# Patient Record
Sex: Female | Born: 1990 | Hispanic: No | Marital: Single | Smoking: Current every day smoker
Health system: Southern US, Community
[De-identification: ages and names within clinical notes are randomized; demographics above are authoritative.]

## PROBLEM LIST (undated history)

## (undated) DIAGNOSIS — F909 Attention-deficit hyperactivity disorder, unspecified type: Secondary | ICD-10-CM

## (undated) HISTORY — DX: Attention-deficit hyperactivity disorder, unspecified type: F90.9

## (undated) HISTORY — PX: NO PAST SURGERIES: SHX2092

---
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
## 2009-03-21 ENCOUNTER — Emergency Department: Payer: Self-pay | Admitting: Emergency Medicine

## 2009-06-03 ENCOUNTER — Emergency Department: Payer: Self-pay | Admitting: Internal Medicine

## 2009-06-05 ENCOUNTER — Other Ambulatory Visit: Payer: Self-pay | Admitting: Emergency Medicine

## 2010-01-06 ENCOUNTER — Emergency Department: Payer: Self-pay | Admitting: Internal Medicine

## 2010-01-13 ENCOUNTER — Emergency Department: Payer: Self-pay | Admitting: Internal Medicine

## 2010-02-25 ENCOUNTER — Emergency Department: Payer: Self-pay | Admitting: Emergency Medicine

## 2010-04-15 ENCOUNTER — Emergency Department: Payer: Self-pay | Admitting: Emergency Medicine

## 2010-04-22 ENCOUNTER — Observation Stay: Payer: Self-pay | Admitting: Obstetrics and Gynecology

## 2010-05-28 ENCOUNTER — Emergency Department: Payer: Self-pay | Admitting: Emergency Medicine

## 2010-08-03 ENCOUNTER — Observation Stay: Payer: Self-pay

## 2010-08-06 ENCOUNTER — Inpatient Hospital Stay: Payer: Self-pay | Admitting: Obstetrics and Gynecology

## 2010-09-15 ENCOUNTER — Emergency Department: Payer: Self-pay | Admitting: Unknown Physician Specialty

## 2013-01-30 ENCOUNTER — Emergency Department: Payer: Self-pay | Admitting: Emergency Medicine

## 2013-01-30 LAB — COMPREHENSIVE METABOLIC PANEL
Albumin: 3.6 g/dL (ref 3.4–5.0)
Anion Gap: 3 — ABNORMAL LOW (ref 7–16)
BUN: 11 mg/dL (ref 7–18)
Calcium, Total: 8.4 mg/dL — ABNORMAL LOW (ref 8.5–10.1)
Chloride: 110 mmol/L — ABNORMAL HIGH (ref 98–107)
Co2: 28 mmol/L (ref 21–32)
Creatinine: 0.78 mg/dL (ref 0.60–1.30)
Potassium: 3.7 mmol/L (ref 3.5–5.1)
SGPT (ALT): 18 U/L (ref 12–78)
Sodium: 141 mmol/L (ref 136–145)
Total Protein: 7.4 g/dL (ref 6.4–8.2)

## 2013-01-30 LAB — URINALYSIS, COMPLETE
Bacteria: NONE SEEN
Glucose,UR: NEGATIVE mg/dL (ref 0–75)
Ketone: NEGATIVE
Leukocyte Esterase: NEGATIVE
Nitrite: NEGATIVE
Ph: 7 (ref 4.5–8.0)
Specific Gravity: 1.026 (ref 1.003–1.030)
WBC UR: 1 /HPF (ref 0–5)

## 2013-01-30 LAB — CBC
HCT: 34 % — ABNORMAL LOW (ref 35.0–47.0)
HGB: 11.4 g/dL — ABNORMAL LOW (ref 12.0–16.0)
MCH: 27.7 pg (ref 26.0–34.0)
MCHC: 33.4 g/dL (ref 32.0–36.0)
MCV: 83 fL (ref 80–100)
RBC: 4.1 10*6/uL (ref 3.80–5.20)
RDW: 14.3 % (ref 11.5–14.5)
WBC: 6.8 10*3/uL (ref 3.6–11.0)

## 2013-02-07 ENCOUNTER — Emergency Department: Payer: Self-pay | Admitting: Emergency Medicine

## 2013-03-22 ENCOUNTER — Ambulatory Visit: Payer: Self-pay | Admitting: Family Medicine

## 2013-05-19 ENCOUNTER — Emergency Department: Payer: Self-pay | Admitting: Emergency Medicine

## 2013-05-19 LAB — CBC
HCT: 35.7 % (ref 35.0–47.0)
HGB: 11.8 g/dL — AB (ref 12.0–16.0)
MCH: 27 pg (ref 26.0–34.0)
MCHC: 33 g/dL (ref 32.0–36.0)
MCV: 82 fL (ref 80–100)
Platelet: 266 10*3/uL (ref 150–440)
RBC: 4.36 10*6/uL (ref 3.80–5.20)
RDW: 14.4 % (ref 11.5–14.5)
WBC: 8.3 10*3/uL (ref 3.6–11.0)

## 2013-05-19 LAB — URINALYSIS, COMPLETE
Bacteria: NONE SEEN
Bilirubin,UR: NEGATIVE
Blood: NEGATIVE
Glucose,UR: NEGATIVE mg/dL (ref 0–75)
KETONE: NEGATIVE
Nitrite: NEGATIVE
Ph: 6 (ref 4.5–8.0)
Protein: NEGATIVE
RBC,UR: 4 /HPF (ref 0–5)
SPECIFIC GRAVITY: 1.023 (ref 1.003–1.030)
Squamous Epithelial: 15
WBC UR: 2 /HPF (ref 0–5)

## 2014-01-10 ENCOUNTER — Emergency Department: Payer: Self-pay | Admitting: Emergency Medicine

## 2014-01-10 LAB — URINALYSIS, COMPLETE
BACTERIA: NONE SEEN
BLOOD: NEGATIVE
Bilirubin,UR: NEGATIVE
GLUCOSE, UR: NEGATIVE mg/dL (ref 0–75)
Ketone: NEGATIVE
NITRITE: NEGATIVE
Ph: 5 (ref 4.5–8.0)
Protein: NEGATIVE
RBC,UR: 8 /HPF (ref 0–5)
SPECIFIC GRAVITY: 1.029 (ref 1.003–1.030)
Squamous Epithelial: 19
WBC UR: 9 /HPF (ref 0–5)

## 2014-01-10 LAB — HCG, QUANTITATIVE, PREGNANCY: BETA HCG, QUANT.: 41857 m[IU]/mL — AB

## 2014-01-10 LAB — COMPREHENSIVE METABOLIC PANEL
ALBUMIN: 3.7 g/dL (ref 3.4–5.0)
ALK PHOS: 76 U/L
AST: 22 U/L (ref 15–37)
Anion Gap: 9 (ref 7–16)
BUN: 9 mg/dL (ref 7–18)
Bilirubin,Total: 0.4 mg/dL (ref 0.2–1.0)
CHLORIDE: 103 mmol/L (ref 98–107)
CO2: 25 mmol/L (ref 21–32)
Calcium, Total: 8.9 mg/dL (ref 8.5–10.1)
Creatinine: 0.76 mg/dL (ref 0.60–1.30)
EGFR (African American): >60
Glucose: 76 mg/dL (ref 65–99)
OSMOLALITY: 271 (ref 275–301)
POTASSIUM: 3.7 mmol/L (ref 3.5–5.1)
SGPT (ALT): 18 U/L
SODIUM: 137 mmol/L (ref 136–145)
TOTAL PROTEIN: 7.4 g/dL (ref 6.4–8.2)

## 2014-01-10 LAB — CBC
HCT: 35.1 % (ref 35.0–47.0)
HGB: 11.3 g/dL — ABNORMAL LOW (ref 12.0–16.0)
MCH: 27 pg (ref 26.0–34.0)
MCHC: 32.2 g/dL (ref 32.0–36.0)
MCV: 84 fL (ref 80–100)
Platelet: 214 10*3/uL (ref 150–440)
RBC: 4.18 10*6/uL (ref 3.80–5.20)
RDW: 14.6 % — AB (ref 11.5–14.5)
WBC: 10.7 10*3/uL (ref 3.6–11.0)

## 2014-03-06 ENCOUNTER — Emergency Department: Payer: Self-pay | Admitting: Emergency Medicine

## 2014-03-06 LAB — COMPREHENSIVE METABOLIC PANEL
ALK PHOS: 80 U/L
ANION GAP: 8 (ref 7–16)
Albumin: 2.9 g/dL — ABNORMAL LOW (ref 3.4–5.0)
BILIRUBIN TOTAL: 0.1 mg/dL — AB (ref 0.2–1.0)
BUN: 7 mg/dL (ref 7–18)
CHLORIDE: 105 mmol/L (ref 98–107)
Calcium, Total: 8.2 mg/dL — ABNORMAL LOW (ref 8.5–10.1)
Co2: 26 mmol/L (ref 21–32)
Creatinine: 0.7 mg/dL (ref 0.60–1.30)
EGFR (Non-African Amer.): >60
GLUCOSE: 113 mg/dL — AB (ref 65–99)
OSMOLALITY: 276 (ref 275–301)
Potassium: 3.6 mmol/L (ref 3.5–5.1)
SGOT(AST): 15 U/L (ref 15–37)
SGPT (ALT): 16 U/L
SODIUM: 139 mmol/L (ref 136–145)
TOTAL PROTEIN: 6.7 g/dL (ref 6.4–8.2)

## 2014-03-06 LAB — URINALYSIS, COMPLETE
BILIRUBIN, UR: NEGATIVE
Bacteria: NONE SEEN
Blood: NEGATIVE
Glucose,UR: NEGATIVE mg/dL (ref 0–75)
Nitrite: NEGATIVE
PH: 6 (ref 4.5–8.0)
Protein: NEGATIVE
RBC,UR: 5 /HPF (ref 0–5)
Specific Gravity: 1.029 (ref 1.003–1.030)
Squamous Epithelial: 15
WBC UR: 11 /HPF (ref 0–5)

## 2014-03-06 LAB — HCG, QUANTITATIVE, PREGNANCY: Beta Hcg, Quant.: 48965 m[IU]/mL — ABNORMAL HIGH

## 2014-03-06 LAB — CBC WITH DIFFERENTIAL/PLATELET
Basophil #: 0 10*3/uL (ref 0.0–0.1)
Basophil %: 0.4 %
EOS ABS: 0.2 10*3/uL (ref 0.0–0.7)
EOS PCT: 1.4 %
HCT: 32.7 % — ABNORMAL LOW (ref 35.0–47.0)
HGB: 10.5 g/dL — ABNORMAL LOW (ref 12.0–16.0)
Lymphocyte #: 1.9 10*3/uL (ref 1.0–3.6)
Lymphocyte %: 18.1 %
MCH: 26.9 pg (ref 26.0–34.0)
MCHC: 32.3 g/dL (ref 32.0–36.0)
MCV: 84 fL (ref 80–100)
MONO ABS: 0.5 x10 3/mm (ref 0.2–0.9)
MONOS PCT: 4.9 %
Neutrophil #: 8 10*3/uL — ABNORMAL HIGH (ref 1.4–6.5)
Neutrophil %: 75.2 %
Platelet: 204 10*3/uL (ref 150–440)
RBC: 3.92 10*6/uL (ref 3.80–5.20)
RDW: 13.6 % (ref 11.5–14.5)
WBC: 10.7 10*3/uL (ref 3.6–11.0)

## 2014-03-28 ENCOUNTER — Emergency Department: Payer: Self-pay | Admitting: Emergency Medicine

## 2014-03-28 LAB — COMPREHENSIVE METABOLIC PANEL
Albumin: 3 g/dL — ABNORMAL LOW (ref 3.4–5.0)
Alkaline Phosphatase: 89 U/L
Anion Gap: 5 — ABNORMAL LOW (ref 7–16)
BUN: 6 mg/dL — AB (ref 7–18)
Bilirubin,Total: 0.3 mg/dL (ref 0.2–1.0)
CALCIUM: 8.2 mg/dL — AB (ref 8.5–10.1)
CHLORIDE: 107 mmol/L (ref 98–107)
Co2: 28 mmol/L (ref 21–32)
Creatinine: 0.54 mg/dL — ABNORMAL LOW (ref 0.60–1.30)
EGFR (Non-African Amer.): >60
GLUCOSE: 81 mg/dL (ref 65–99)
Osmolality: 276 (ref 275–301)
Potassium: 3.7 mmol/L (ref 3.5–5.1)
SGOT(AST): 16 U/L (ref 15–37)
SGPT (ALT): 20 U/L
Sodium: 140 mmol/L (ref 136–145)
TOTAL PROTEIN: 7.2 g/dL (ref 6.4–8.2)

## 2014-03-28 LAB — URINALYSIS, COMPLETE
BILIRUBIN, UR: NEGATIVE
Bacteria: NONE SEEN
GLUCOSE, UR: NEGATIVE mg/dL (ref 0–75)
Ketone: NEGATIVE
Nitrite: NEGATIVE
Ph: 5 (ref 4.5–8.0)
RBC,UR: 405 /HPF (ref 0–5)
SPECIFIC GRAVITY: 1.027 (ref 1.003–1.030)
Squamous Epithelial: 7

## 2014-03-28 LAB — CBC
HCT: 31.8 % — ABNORMAL LOW (ref 35.0–47.0)
HGB: 10.3 g/dL — ABNORMAL LOW (ref 12.0–16.0)
MCH: 27 pg (ref 26.0–34.0)
MCHC: 32.3 g/dL (ref 32.0–36.0)
MCV: 84 fL (ref 80–100)
PLATELETS: 223 10*3/uL (ref 150–440)
RBC: 3.8 10*6/uL (ref 3.80–5.20)
RDW: 13.5 % (ref 11.5–14.5)
WBC: 11.8 10*3/uL — ABNORMAL HIGH (ref 3.6–11.0)

## 2014-03-28 LAB — HCG, QUANTITATIVE, PREGNANCY: BETA HCG, QUANT.: 23052 m[IU]/mL — AB

## 2014-06-18 ENCOUNTER — Observation Stay: Payer: Self-pay

## 2014-06-19 LAB — CBC WITH DIFFERENTIAL/PLATELET
BASOS ABS: 0 10*3/uL (ref 0.0–0.1)
Basophil %: 0.3 %
EOS ABS: 0.2 10*3/uL (ref 0.0–0.7)
Eosinophil %: 1.8 %
HCT: 26 % — ABNORMAL LOW (ref 35.0–47.0)
HGB: 8.4 g/dL — ABNORMAL LOW (ref 12.0–16.0)
LYMPHS ABS: 2.3 10*3/uL (ref 1.0–3.6)
LYMPHS PCT: 16.8 %
MCH: 26.2 pg (ref 26.0–34.0)
MCHC: 32.1 g/dL (ref 32.0–36.0)
MCV: 82 fL (ref 80–100)
Monocyte #: 1.2 x10 3/mm — ABNORMAL HIGH (ref 0.2–0.9)
Monocyte %: 8.6 %
NEUTROS ABS: 9.9 10*3/uL — AB (ref 1.4–6.5)
NEUTROS PCT: 72.5 %
PLATELETS: 217 10*3/uL (ref 150–440)
RBC: 3.19 10*6/uL — AB (ref 3.80–5.20)
RDW: 15.8 % — ABNORMAL HIGH (ref 11.5–14.5)
WBC: 13.7 10*3/uL — ABNORMAL HIGH (ref 3.6–11.0)

## 2014-06-19 LAB — COMPREHENSIVE METABOLIC PANEL
Albumin: 2.4 g/dL — ABNORMAL LOW (ref 3.4–5.0)
Alkaline Phosphatase: 130 U/L — ABNORMAL HIGH (ref 46–116)
Anion Gap: 8 (ref 7–16)
BUN: 8 mg/dL (ref 7–18)
Bilirubin,Total: 0.2 mg/dL (ref 0.2–1.0)
CHLORIDE: 106 mmol/L (ref 98–107)
CO2: 23 mmol/L (ref 21–32)
Calcium, Total: 8.5 mg/dL (ref 8.5–10.1)
Creatinine: 0.56 mg/dL — ABNORMAL LOW (ref 0.60–1.30)
EGFR (Non-African Amer.): >60
Glucose: 94 mg/dL (ref 65–99)
Osmolality: 272 (ref 275–301)
Potassium: 3.2 mmol/L — ABNORMAL LOW (ref 3.5–5.1)
SGOT(AST): 11 U/L — ABNORMAL LOW (ref 15–37)
SGPT (ALT): 14 U/L (ref 14–63)
SODIUM: 137 mmol/L (ref 136–145)
TOTAL PROTEIN: 6.2 g/dL — AB (ref 6.4–8.2)

## 2014-06-19 LAB — URINALYSIS, COMPLETE
Bilirubin,UR: NEGATIVE
Nitrite: NEGATIVE
Ph: 5 (ref 4.5–8.0)
RBC,UR: 3 /HPF (ref 0–5)
Specific Gravity: 1.031 (ref 1.003–1.030)
Squamous Epithelial: 5

## 2014-06-20 LAB — URINE CULTURE

## 2014-08-05 ENCOUNTER — Observation Stay: Payer: Self-pay | Admitting: Obstetrics & Gynecology

## 2014-08-05 LAB — URINALYSIS, COMPLETE
BILIRUBIN, UR: NEGATIVE
Bacteria: NONE SEEN
Glucose,UR: NEGATIVE mg/dL (ref 0–75)
KETONE: NEGATIVE
Nitrite: NEGATIVE
PROTEIN: NEGATIVE
Ph: 6 (ref 4.5–8.0)
RBC,UR: <1 /HPF (ref 0–5)
SPECIFIC GRAVITY: 1.015 (ref 1.003–1.030)
WBC UR: NONE SEEN /HPF (ref 0–5)

## 2014-08-17 ENCOUNTER — Observation Stay
Admit: 2014-08-17 | Disposition: A | Discharge status: Home / Self Care | Payer: Self-pay | Attending: Certified Nurse Midwife | Admitting: Certified Nurse Midwife

## 2014-08-30 ENCOUNTER — Inpatient Hospital Stay
Admit: 2014-08-30 | Disposition: A | Discharge status: Home / Self Care | Payer: Self-pay | Attending: Certified Nurse Midwife | Admitting: Certified Nurse Midwife

## 2014-08-30 LAB — CBC WITH DIFFERENTIAL/PLATELET
BASOS ABS: 0.1 10*3/uL (ref 0.0–0.1)
Basophil %: 0.4 %
Eosinophil #: 0.2 10*3/uL (ref 0.0–0.7)
Eosinophil %: 1.6 %
HCT: 29.8 % — ABNORMAL LOW (ref 35.0–47.0)
HGB: 9.5 g/dL — ABNORMAL LOW (ref 12.0–16.0)
Lymphocyte #: 2.4 10*3/uL (ref 1.0–3.6)
Lymphocyte %: 17.4 %
MCH: 25.8 pg — ABNORMAL LOW (ref 26.0–34.0)
MCHC: 31.9 g/dL — ABNORMAL LOW (ref 32.0–36.0)
MCV: 81 fL (ref 80–100)
MONO ABS: 0.9 x10 3/mm (ref 0.2–0.9)
MONOS PCT: 6.3 %
Neutrophil #: 10.3 10*3/uL — ABNORMAL HIGH (ref 1.4–6.5)
Neutrophil %: 74.3 %
PLATELETS: 259 10*3/uL (ref 150–440)
RBC: 3.69 10*6/uL — ABNORMAL LOW (ref 3.80–5.20)
RDW: 18.1 % — AB (ref 11.5–14.5)
WBC: 13.9 10*3/uL — AB (ref 3.6–11.0)

## 2014-08-31 LAB — HEMATOCRIT: HCT: 26.5 % — ABNORMAL LOW (ref 35.0–47.0)

## 2014-08-31 LAB — GC/CHLAMYDIA PROBE AMP

## 2014-09-03 ENCOUNTER — Emergency Department
Admit: 2014-09-03 | Disposition: A | Discharge status: Home / Self Care | Payer: Self-pay | Admitting: Emergency Medicine

## 2014-09-03 LAB — COMPREHENSIVE METABOLIC PANEL
ALBUMIN: 3 g/dL — AB
ALK PHOS: 156 U/L — AB
ALT: 21 U/L
ANION GAP: 8 (ref 7–16)
BUN: 7 mg/dL
Bilirubin,Total: 0.3 mg/dL
CALCIUM: 8.7 mg/dL — AB
CO2: 24 mmol/L
Chloride: 106 mmol/L
Creatinine: 0.53 mg/dL
EGFR (African American): >60
EGFR (Non-African Amer.): >60
Glucose: 94 mg/dL
Potassium: 3.6 mmol/L
SGOT(AST): 24 U/L
Sodium: 138 mmol/L
Total Protein: 6.8 g/dL

## 2014-09-03 LAB — URINALYSIS, COMPLETE
Bilirubin,UR: NEGATIVE
GLUCOSE, UR: NEGATIVE mg/dL (ref 0–75)
Ketone: NEGATIVE
NITRITE: NEGATIVE
PROTEIN: NEGATIVE
Ph: 6 (ref 4.5–8.0)
Specific Gravity: 1.005 (ref 1.003–1.030)

## 2014-09-03 LAB — CBC WITH DIFFERENTIAL/PLATELET
Basophil #: 0 10*3/uL (ref 0.0–0.1)
Basophil %: 0.2 %
EOS PCT: 4.3 %
Eosinophil #: 0.6 10*3/uL (ref 0.0–0.7)
HCT: 29.7 % — ABNORMAL LOW (ref 35.0–47.0)
HGB: 9.4 g/dL — AB (ref 12.0–16.0)
LYMPHS ABS: 1.6 10*3/uL (ref 1.0–3.6)
Lymphocyte %: 11.8 %
MCH: 25.6 pg — AB (ref 26.0–34.0)
MCHC: 31.7 g/dL — ABNORMAL LOW (ref 32.0–36.0)
MCV: 81 fL (ref 80–100)
MONOS PCT: 6.5 %
Monocyte #: 0.9 x10 3/mm (ref 0.2–0.9)
NEUTROS ABS: 10.2 10*3/uL — AB (ref 1.4–6.5)
Neutrophil %: 77.2 %
PLATELETS: 255 10*3/uL (ref 150–440)
RBC: 3.68 10*6/uL — AB (ref 3.80–5.20)
RDW: 18.3 % — ABNORMAL HIGH (ref 11.5–14.5)
WBC: 13.2 10*3/uL — ABNORMAL HIGH (ref 3.6–11.0)

## 2014-09-03 LAB — WET PREP, GENITAL

## 2014-09-04 LAB — GC/CHLAMYDIA PROBE AMP

## 2014-09-20 NOTE — H&P (Signed)
L&D Evaluation:  History:  HPI Pt is a G3P1011 at 29 weeks with an EDC of 08/31/14 presents with reports of diffuse abdominal pain since yesterday that has gotten worse. She denies back pain, cramping, nausea, vomiting, diarrhea, fever, chills, or sick contacts. Her prenatal course is significant for obesity with BMI of 32. She is O+, RI, VI.   Presents with abdominal pain   Patient's Surgical History none   Medications Pre Natal Vitamins   Allergies augmentin   Social History none   Family History Non-Contributory   ROS:  ROS All systems were reviewed.  HEENT, CNS, GI, GU, Respiratory, CV, Renal and Musculoskeletal systems were found to be normal.   Exam:  Vital Signs stable   General no apparent distress, pt sleeping everytime provider or nurse goes into the room to see the patient   Mental Status clear   Chest clear   Heart normal sinus rhythm   Abdomen tender on the lower abdomen   Back no CVAT   Mebranes Intact   FHT normal rate with no decels, 145, moderate variability, +accels, no decels   Ucx absent   Skin dry, no lesions, no rashes   Lymph no lymphadenopathy   Other UA- 1+ ketones, 1.031, negative nitrites, 3 RBCs, 9 WBCs, trace bacteria, 5 epi cells.  CBC- WBC 13.7 H&H- 8.4, 26.0   Impression:  Impression UTI, reactive NST, IUP, UTI, iron deficiency anemia, reactive NST   Plan:  Plan discharge, po antibiotics x1 here, encourage PO hydration. Will send pt home with rx for abx for UTI.   Follow Up Appointment already scheduled   Electronic Signatures: Jannet MantisSubudhi, Aubrei Bouchie (CNM)  (Signed 07-Feb-16 02:41)  Authored: L&D Evaluation   Last Updated: 07-Feb-16 02:41 by Jannet MantisSubudhi, Allex Madia (CNM)

## 2014-09-20 NOTE — H&P (Signed)
L&D Evaluation:  History:  HPI Pt is a G3P1011 at 39.6 weeks with an EDC of 08/31/14 presents with reports of contractions and a gush of fluid. +FM. She denies vaginal bleeding. Her prenatal course is significant for obesity with BMI of 32 and smoking. She is O+, RI, VI, and GBS negative.   Presents with contractions   Patient's Medical History Obesity   Patient's Surgical History none   Medications Pre Natal Vitamins   Allergies augmentin   Social History tobacco   Family History Non-Contributory   ROS:  ROS All systems were reviewed.  HEENT, CNS, GI, GU, Respiratory, CV, Renal and Musculoskeletal systems were found to be normal.   Exam:  Vital Signs stable   General no apparent distress   Mental Status clear   Chest clear   Heart normal sinus rhythm   Abdomen gravid, non-tender   Estimated Fetal Weight Average for gestational age   Pelvic no external lesions, 4/50/-2 at 4:21am   Mebranes negative nitrazine.   FHT normal rate with no decels   Ucx irregular, every 3-5 minutes   Skin dry, no lesions, no rashes   Impression:  Impression reactive NST, IUP at 649w6d, r/o labor, no evidence of SROM   Plan:  Plan EFM/NST, monitor contractions and for cervical change, will reassess cervix in 1-2 hours   Follow Up Appointment already scheduled. today   Electronic Signatures: Jannet MantisSubudhi, Kolleen Ochsner (CNM)  (Signed 19-Apr-16 04:59)  Authored: L&D Evaluation   Last Updated: 19-Apr-16 04:59 by Jannet MantisSubudhi, Henessy Rohrer (CNM)

## 2014-09-20 NOTE — H&P (Signed)
L&D Evaluation:  History Expanded:  HPI Pt is a G3P1011 at 36 weeks with an EDC of 08/31/14 presents with reports of contractions for a few days. She denies vaginal bleeding, ROM, back pain, nausea, vomiting, diarrhea, fever, chills, or sick contacts. Her prenatal course is significant for obesity with BMI of 32. She is O+, RI, VI.   Blood Type (Maternal) O positive   EDC 31-Aug-2014   Presents with contractions   Patient's Medical History Obesity   Patient's Surgical History none   Medications Pre Natal Vitamins   Allergies augmentin   Social History none   Family History Non-Contributory   ROS:  ROS All systems were reviewed.  HEENT, CNS, GI, GU, Respiratory, CV, Renal and Musculoskeletal systems were found to be normal.   Exam:  Vital Signs stable   General no apparent distress   Mental Status clear   Chest clear   Heart normal sinus rhythm   Abdomen gravid, non-tender   Estimated Fetal Weight Average for gestational age   Back no CVAT   Edema no edema   Pelvic no external lesions, cervix closed and thick   Mebranes Intact   FHT normal rate with no decels, 145, accels   Ucx absent   Skin dry, no lesions, no rashes   Impression:  Impression IUP, false labor   Plan:  Plan UA, EFM/NST, monitor contractions and for cervical change, fluids   Comments A NST procedure was performed with FHR monitoring and a normal baseline established, appropriate time of 20-40 minutes of evaluation, and accels >2 seen w 15x15 characteristics.  Results show a REACTIVE Non-Stress Test.  Monitor for UTI.   Follow Up Appointment already scheduled   Electronic Signatures: Letitia LibraHarris, Robert Paul (MD)  (Signed 25-Mar-16 15:16)  Authored: L&D Evaluation   Last Updated: 25-Mar-16 15:16 by Letitia LibraHarris, Robert Paul (MD)

## 2015-06-18 IMAGING — US US OB LIMITED
1 series · 14 of 22 positions shown · non-contrast
Comparison: Prior examination 03/06/2014.

CLINICAL DATA: Second trimester pregnancy with vaginal bleeding.
LMP 11/23/2013.

EXAM:
LIMITED OBSTETRIC ULTRASOUND

[Series 1: us ob limited · 0.22mm/px · 14 of 22 slices shown]
[im 1/22]
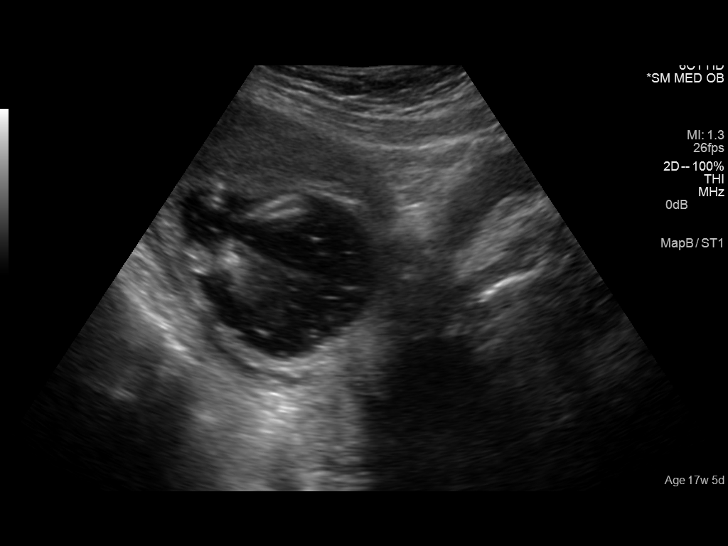
[im 3/22]
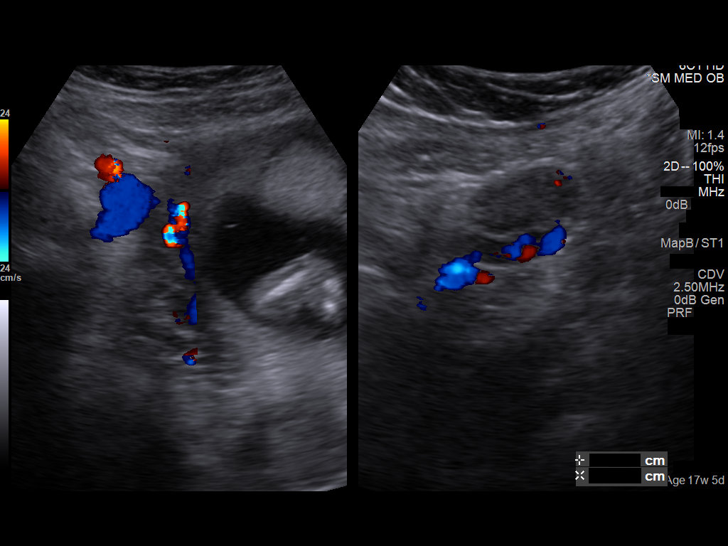
[im 4/22]
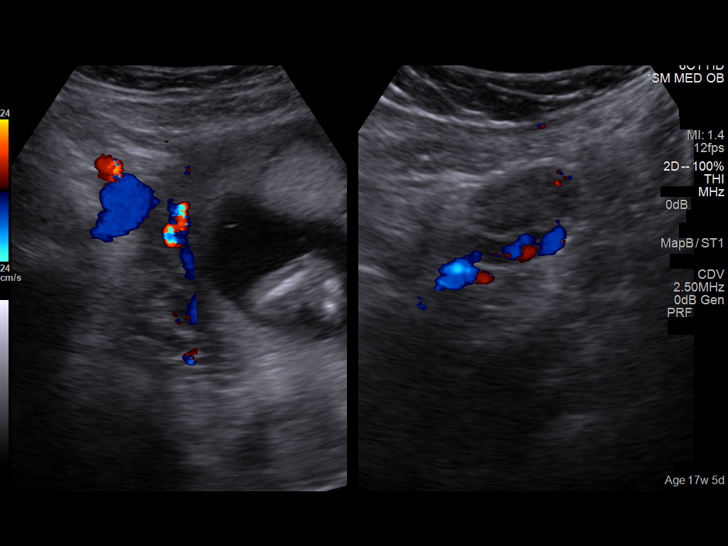
[im 6/22]
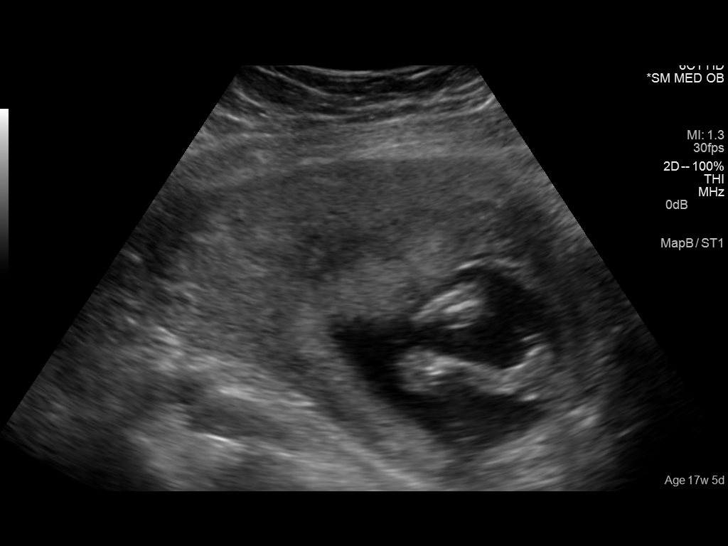
[im 8/22]
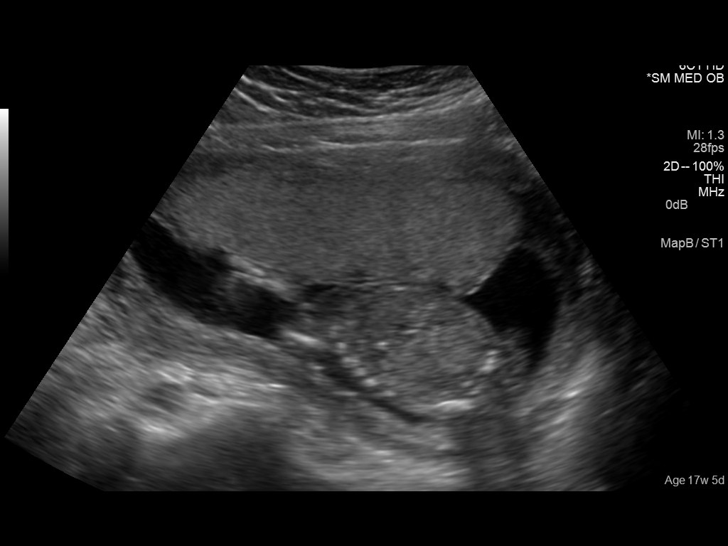
[im 9/22]
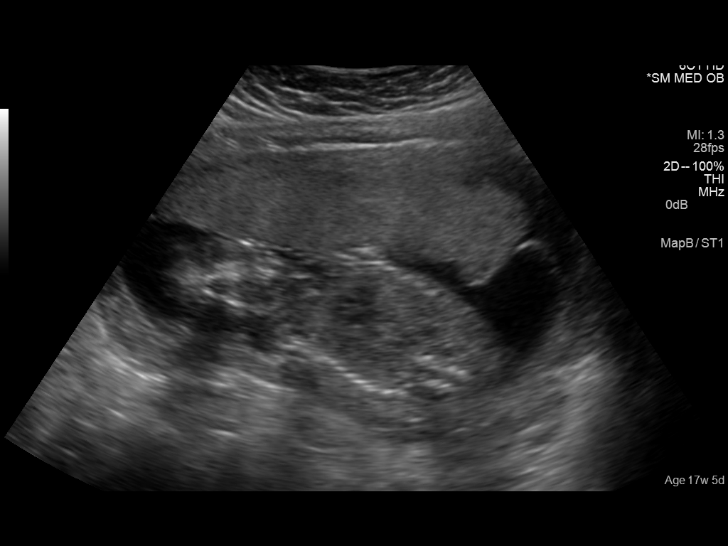
[im 11/22]
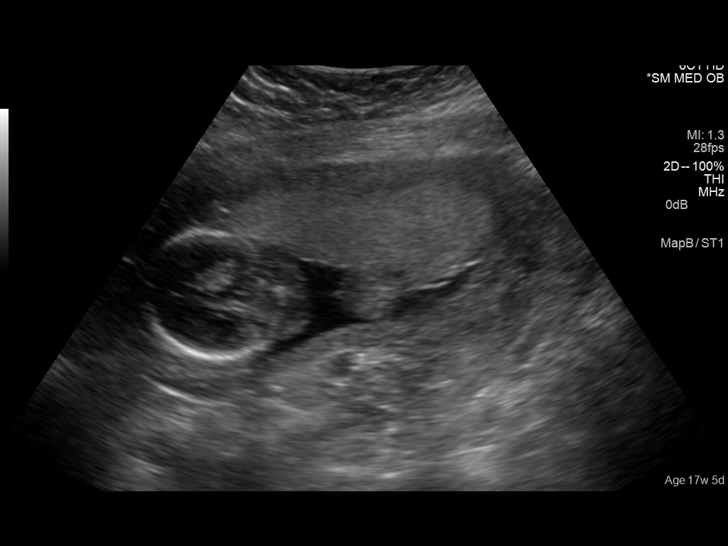
[im 12/22]
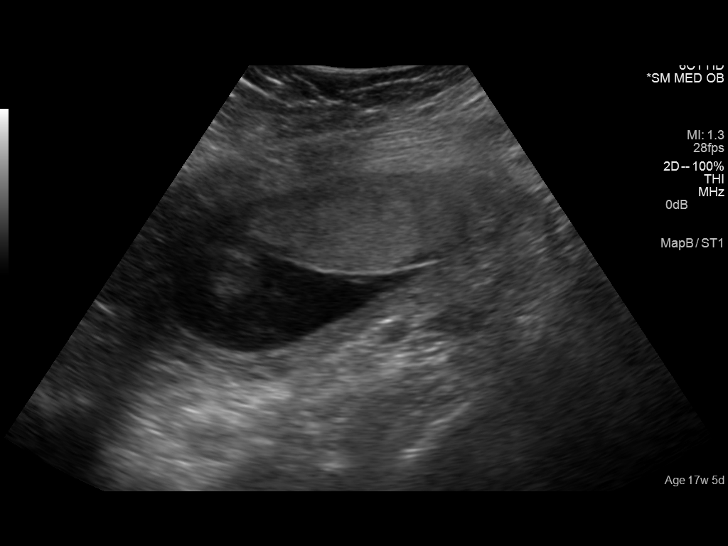
[im 14/22]
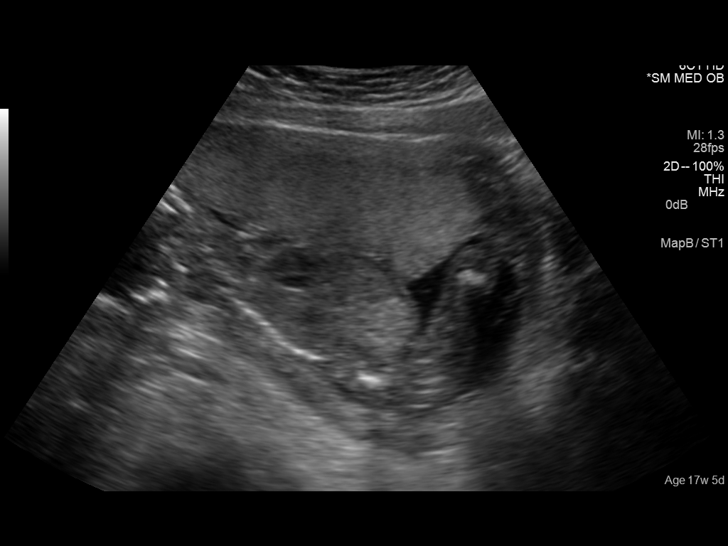
[im 15/22]
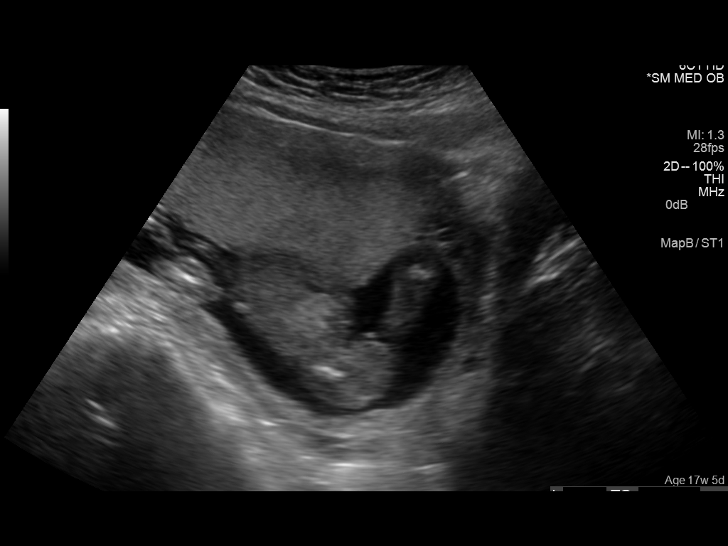
[im 17/22]
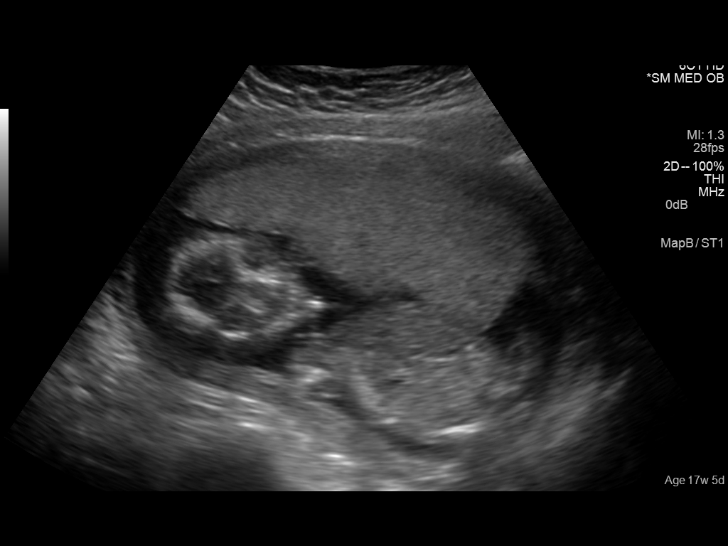
[im 19/22]
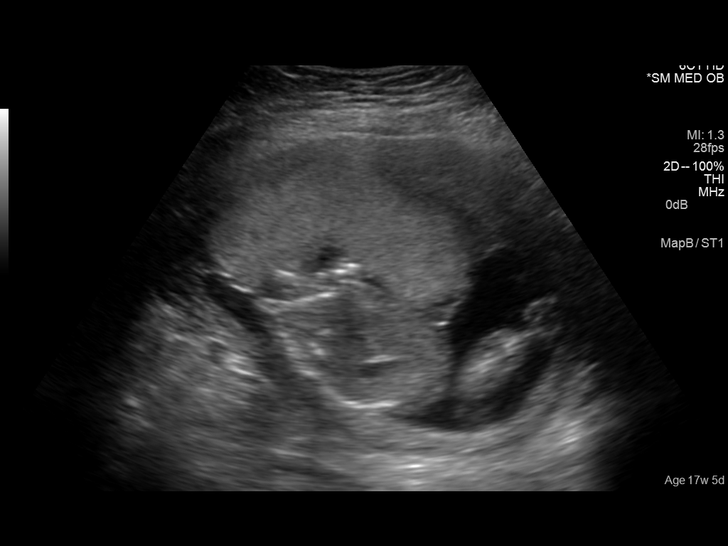
[im 20/22]
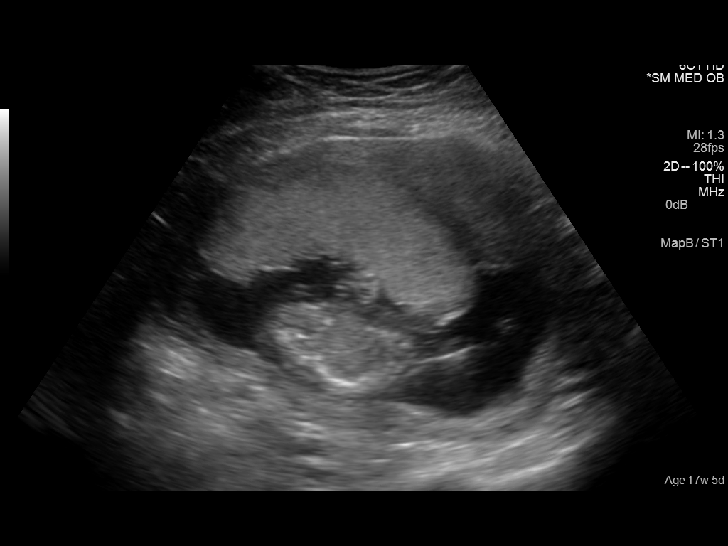
[im 22/22]
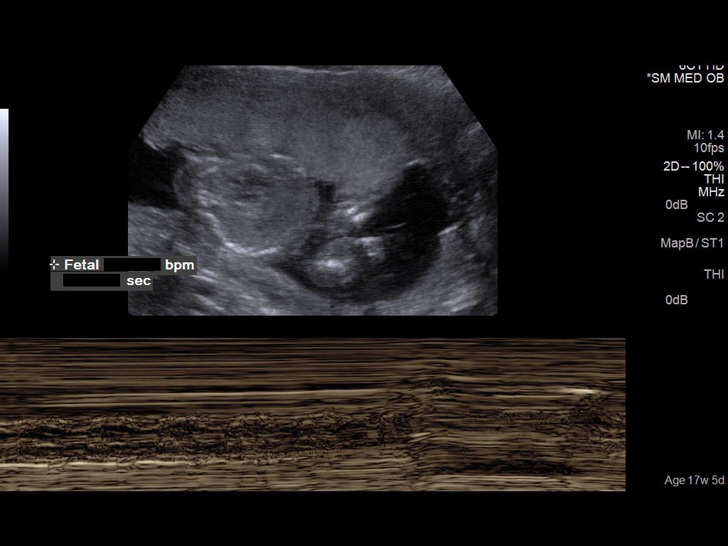

[14 of 22 positions shown; findings below may reference images not displayed]

FINDINGS: Number of Fetuses: 1

Heart Rate:  143 bpm

Movement: Yes

Presentation: Breech

Placental Location: Anterior

Previa: No

Amniotic Fluid (Subjective):  Within normal limits.

BPD:  3.8cm; 17w  4d

MATERNAL FINDINGS:

Cervix:  Appears closed.

Uterus/Adnexae:  No abnormality visualized.
IMPRESSION: Single living intrauterine gestation demonstrating interval growth
within expected measurement variation. No demonstrated complication.

This exam is performed on an emergent basis and does not
comprehensively evaluate fetal size, dating, or anatomy; follow-up
complete OB US should be considered if further fetal assessment is
warranted.

## 2016-09-30 ENCOUNTER — Other Ambulatory Visit: Payer: Self-pay | Admitting: Physician Assistant

## 2016-09-30 ENCOUNTER — Ambulatory Visit
Admission: RE | Admit: 2016-09-30 | Discharge: 2016-09-30 | Disposition: A | Discharge status: Home / Self Care | Payer: Medicaid Other | Source: Ambulatory Visit | Attending: Physician Assistant | Admitting: Physician Assistant

## 2016-09-30 ENCOUNTER — Other Ambulatory Visit (HOSPITAL_COMMUNITY): Payer: Self-pay | Admitting: Physician Assistant

## 2016-09-30 DIAGNOSIS — M79605 Pain in left leg: Secondary | ICD-10-CM

## 2017-02-25 ENCOUNTER — Ambulatory Visit: Payer: Self-pay | Admitting: Family Medicine

## 2017-03-25 ENCOUNTER — Ambulatory Visit (INDEPENDENT_AMBULATORY_CARE_PROVIDER_SITE_OTHER): Payer: Medicaid Other | Admitting: Physician Assistant

## 2017-03-25 ENCOUNTER — Encounter: Payer: Self-pay | Admitting: Physician Assistant

## 2017-03-25 VITALS — BP 118/60 | HR 76 | Temp 98.4°F | Resp 16 | Wt 212.0 lb

## 2017-03-25 DIAGNOSIS — Z975 Presence of (intrauterine) contraceptive device: Secondary | ICD-10-CM

## 2017-03-25 DIAGNOSIS — Z0000 Encounter for general adult medical examination without abnormal findings: Secondary | ICD-10-CM | POA: Diagnosis not present

## 2017-03-25 DIAGNOSIS — F419 Anxiety disorder, unspecified: Secondary | ICD-10-CM | POA: Diagnosis not present

## 2017-03-25 DIAGNOSIS — Z862 Personal history of diseases of the blood and blood-forming organs and certain disorders involving the immune mechanism: Secondary | ICD-10-CM

## 2017-03-25 DIAGNOSIS — Z13220 Encounter for screening for lipoid disorders: Secondary | ICD-10-CM | POA: Diagnosis not present

## 2017-03-25 DIAGNOSIS — F329 Major depressive disorder, single episode, unspecified: Secondary | ICD-10-CM

## 2017-03-25 DIAGNOSIS — Z1329 Encounter for screening for other suspected endocrine disorder: Secondary | ICD-10-CM | POA: Diagnosis not present

## 2017-03-25 DIAGNOSIS — Z131 Encounter for screening for diabetes mellitus: Secondary | ICD-10-CM | POA: Diagnosis not present

## 2017-03-25 DIAGNOSIS — F32A Depression, unspecified: Secondary | ICD-10-CM

## 2017-03-25 MED ORDER — SERTRALINE HCL 50 MG PO TABS: 50.0000 mg | ORAL_TABLET | Freq: Every day | ORAL | 2 refills | Status: DC

## 2017-03-25 NOTE — Progress Notes (Signed)
Patient: Brittney [name] Female    DOB: 09/04/1990   26 y.o.   MRN: [id number] Visit Date: 03/26/2017  Today's Provider: Trey SailorsAdriana M Pollak, PA-C   Chief Complaint  Patient presents with  . Establish Care  . Depression    Chronic issue; but worsening recently.   . Anxiety   Subjective:    Brittney [name] is a 26 y/o woman who presents today to establish care. She does not have a prior PCP.  She lives with her mother and two children, ages 586 and 2, in [place], [place]. She is not currently working. Having some difficulty with weight and eating fast food.   Reports she had a [facility] 1-2 years ago. Has Nexplanon for contraception, periods are irregular.   Having some issues with depression and anxiety as below. Difficult situations with fathers of her children. Father of her oldest is in prison. Father of youngest left the relationship. She is currently in a relationship where she feels safe and supported. Can become overwhelmed by these factors, has dealt with this for many years. Would like to seek counseling, possibly start medication.   Depression       The patient presents with depression.  This is a chronic problem.  The current episode started 1 to 4 weeks ago.   The problem has been gradually worsening since onset.  Associated symptoms include decreased concentration, fatigue, insomnia and restlessness.  Associated symptoms include no suicidal ideas.  Past treatments include nothing.  Past medical history includes anxiety and depression.     Pertinent negatives include no suicide attempts. Anxiety  Presents for initial visit. The problem has been gradually worsening. Symptoms include decreased concentration, depressed mood, excessive worry, insomnia, nervous/anxious behavior, panic and restlessness. Patient reports no chest pain, feeling of choking or suicidal ideas. The quality of sleep is poor. Nighttime awakenings: several.   Her past medical history is significant for  anxiety/panic attacks and depression. There is no history of suicide attempts. Past treatments include nothing.       Allergies  Allergen Reactions  . Amoxicillin-Pot Clavulanate      Current Outpatient Medications:  .  etonogestrel (NEXPLANON) 68 MG IMPL implant, 1 each once by Subdermal route., Disp: , Rfl:  .  sertraline (ZOLOFT) 50 MG tablet, Take 1 tablet (50 mg total) daily by mouth., Disp: 30 tablet, Rfl: 2  Review of Systems  Constitutional: Positive for fatigue.  Cardiovascular: Negative for chest pain.  Psychiatric/Behavioral: Positive for decreased concentration and depression. Negative for suicidal ideas. The patient is nervous/anxious and has insomnia.     Social History   Tobacco Use  . Smoking status: Current Every Day Smoker    Packs/day: 0.50    Years: 4.00    Pack years: 2.00  . Smokeless tobacco: Never Used  Substance Use Topics  . Alcohol use: Yes    Alcohol/week: 7.8 oz    Types: 5 Cans of beer, 8 Shots of liquor per week   Objective:   BP 118/60 (BP Location: Right Arm, Patient Position: Sitting, Cuff Size: Large)   Pulse 76   Temp 98.4 F (36.9 C) (Oral)   Resp 16   Wt 212 lb (96.2 kg)  Vitals:   03/25/17 1505  BP: 118/60  Pulse: 76  Resp: 16  Temp: 98.4 F (36.9 C)  TempSrc: Oral  Weight: 212 lb (96.2 kg)     Physical Exam  Constitutional: She is oriented to person,  place, and time. She appears well-developed and well-nourished.  HENT:  Right Ear: External ear normal.  Left Ear: External ear normal.  Mouth/Throat: Oropharynx is clear and moist. No oropharyngeal exudate.  Eyes: Conjunctivae are normal.  Cardiovascular: Normal rate and regular rhythm.  Pulmonary/Chest: Effort normal and breath sounds normal.  Abdominal: Soft. Bowel sounds are normal.  Neurological: She is alert and oriented to person, place, and time.  Skin: Skin is warm and dry.  Psychiatric: She has a normal mood and affect. Her behavior is normal.          Assessment & Plan:     1. Annual physical exam   2. Anxiety and depression  Will start zoloft, counseled on side effects and how long it takes to work. Needs to be taken consistently.   - sertraline (ZOLOFT) 50 MG tablet; Take 1 tablet (50 mg total) daily by mouth.  Dispense: 30 tablet; Refill: 2 - Ambulatory referral to Psychiatry  3. Nexplanon in place  We can remove this here.  4. Screening cholesterol level  - Lipid Profile  5. History of anemia  - CBC with Differential  6. Thyroid disorder screening  - TSH  7. Diabetes mellitus screening  - Comprehensive Metabolic Panel (CMET)  Return in about 1 month (around 04/24/2017) for depression/anxiety.  The entirety of the information documented in the History of Present Illness, Review of Systems and Physical Exam were personally obtained by me. Portions of this information were initially documented by Kavin LeechLaura Walsh, CMA and reviewed by me for thoroughness and accuracy.         Trey SailorsAdriana M Pollak, PA-C  [facility]

## 2017-03-26 DIAGNOSIS — Z975 Presence of (intrauterine) contraceptive device: Secondary | ICD-10-CM | POA: Insufficient documentation

## 2017-03-26 DIAGNOSIS — F419 Anxiety disorder, unspecified: Secondary | ICD-10-CM | POA: Insufficient documentation

## 2017-03-26 DIAGNOSIS — F32A Depression, unspecified: Secondary | ICD-10-CM | POA: Insufficient documentation

## 2017-03-26 DIAGNOSIS — F329 Major depressive disorder, single episode, unspecified: Secondary | ICD-10-CM | POA: Insufficient documentation

## 2017-03-26 NOTE — Patient Instructions (Signed)

## 2017-04-17 ENCOUNTER — Other Ambulatory Visit: Payer: Self-pay | Admitting: Physician Assistant

## 2017-04-17 DIAGNOSIS — F419 Anxiety disorder, unspecified: Secondary | ICD-10-CM

## 2017-04-17 DIAGNOSIS — F32A Depression, unspecified: Secondary | ICD-10-CM

## 2017-04-17 DIAGNOSIS — F329 Major depressive disorder, single episode, unspecified: Secondary | ICD-10-CM

## 2017-04-17 NOTE — Progress Notes (Signed)
Re-entered referral for counseling. May we please cancel psychiatry appointment and set up therapy/counseling? Thank you.

## 2017-04-18 ENCOUNTER — Telehealth: Payer: Self-pay | Admitting: Physician Assistant

## 2017-04-18 NOTE — Telephone Encounter (Signed)
[facility] to cancel appointment with psychiatry.She will contact pt with appointment to therapist

## 2017-04-22 ENCOUNTER — Ambulatory Visit: Payer: Self-pay | Admitting: Psychiatry

## 2017-04-25 ENCOUNTER — Encounter: Payer: Self-pay | Admitting: Physician Assistant

## 2017-04-29 ENCOUNTER — Ambulatory Visit: Payer: Medicaid Other | Admitting: Physician Assistant

## 2017-05-15 ENCOUNTER — Ambulatory Visit: Payer: Medicaid Other | Admitting: Licensed Clinical Social Worker

## 2017-07-17 ENCOUNTER — Emergency Department
Admission: EM | Admit: 2017-07-17 | Discharge: 2017-07-17 | Disposition: A | Discharge status: Home / Self Care | Payer: Self-pay | Attending: Emergency Medicine | Admitting: Emergency Medicine

## 2017-07-17 ENCOUNTER — Other Ambulatory Visit: Payer: Self-pay

## 2017-07-17 ENCOUNTER — Encounter: Payer: Self-pay | Admitting: Emergency Medicine

## 2017-07-17 DIAGNOSIS — F17210 Nicotine dependence, cigarettes, uncomplicated: Secondary | ICD-10-CM | POA: Insufficient documentation

## 2017-07-17 DIAGNOSIS — Z79899 Other long term (current) drug therapy: Secondary | ICD-10-CM | POA: Insufficient documentation

## 2017-07-17 DIAGNOSIS — B9789 Other viral agents as the cause of diseases classified elsewhere: Secondary | ICD-10-CM

## 2017-07-17 DIAGNOSIS — J069 Acute upper respiratory infection, unspecified: Secondary | ICD-10-CM | POA: Insufficient documentation

## 2017-07-17 LAB — INFLUENZA PANEL BY PCR (TYPE A & B)
Influenza A By PCR: NEGATIVE
Influenza B By PCR: NEGATIVE

## 2017-07-17 LAB — GROUP A STREP BY PCR: Group A Strep by PCR: NOT DETECTED

## 2017-07-17 NOTE — Discharge Instructions (Signed)
Follow-up with your primary care provider or [facility] acute care if any continued problems.  Continue TheraFlu at home along with Tylenol or ibuprofen as needed for fever or throat pain.  Increase fluids.

## 2017-07-17 NOTE — ED Triage Notes (Signed)
Patient ambulatory to triage with steady gait, without difficulty or distress noted, mask in place; pt reports sore throat, chills and right earache

## 2017-07-17 NOTE — ED Provider Notes (Signed)
[facility] Emergency Department Provider Note   ____________________________________________   First MD Initiated Contact with Patient 07/17/17 0710     (approximate)  I have reviewed the triage vital signs and the nursing notes.   HISTORY  Chief Complaint Sore Throat   HPI Brittney [name] is a 27 y.o. female is here complaint of sore throat, cough, nasal congestion for 2 days.  Patient states she has had up subjective fever.  She has not take any ibuprofen or Tylenol in the last 24 hours.  Patient has a cough but has been taking medication at home which helps with her cough and she denies any difficulty sleeping.  She is fearful that she has the flu and is strongly requesting that she be tested for both strep and flu.  History reviewed. No pertinent past medical history.  Patient Active Problem List   Diagnosis Date Noted  . Anxiety and depression 03/26/2017  . Nexplanon in place 03/26/2017    Past Surgical History:  Procedure Laterality Date  . NO PAST SURGERIES      Prior to Admission medications   Medication Sig Start Date End Date Taking? Authorizing Provider  etonogestrel (NEXPLANON) 68 MG IMPL implant 1 each once by Subdermal route.    [provider]  sertraline (ZOLOFT) 50 MG tablet Take 1 tablet (50 mg total) daily by mouth. 03/25/17 06/23/17  Trey SailorsPollak, Adriana M, PA-C    Allergies Amoxicillin-pot clavulanate  Family History  Problem Relation Age of Onset  . Hypertension Mother   . Hypertension Father   . Asthma Brother   . Diabetes Maternal Grandmother   . Heart disease Maternal Grandfather   . Heart attack Maternal Aunt   . Asthma Daughter     Social History Social History   Tobacco Use  . Smoking status: Current Every Day Smoker    Packs/day: 0.50    Years: 4.00    Pack years: 2.00  . Smokeless tobacco: Never Used  Substance Use Topics  . Alcohol use: Yes    Alcohol/week: 7.8 oz    Types: 5 Cans of beer, 8  Shots of liquor per week  . Drug use: Yes    Types: Marijuana    Review of Systems Constitutional: Subjective fever/chills Eyes: No visual changes. ENT: Positive sore throat.  Positive nasal congestion.  Positive right ear pain. Cardiovascular: Denies chest pain. Respiratory: Denies shortness of breath. Gastrointestinal: No abdominal pain.  No nausea, no vomiting.  Minimal diarrhea.   Genitourinary: Negative for dysuria. Musculoskeletal: Negative for back pain. Skin: Negative for rash. Neurological: Negative for headaches, focal weakness or numbness. ____________________________________________   PHYSICAL EXAM:  VITAL SIGNS: ED Triage Vitals  Enc Vitals Group     BP 07/17/17 0644 132/73     Pulse Rate 07/17/17 0644 79     Resp 07/17/17 0644 18     Temp 07/17/17 0644 98.3 F (36.8 C)     Temp Source 07/17/17 0644 Oral     SpO2 07/17/17 0644 100 %     Weight 07/17/17 0642 206 lb (93.4 kg)     Height 07/17/17 0642 5\' 7"  (1.702 m)     Head Circumference --      Peak Flow --      Pain Score 07/17/17 0642 7     Pain Loc --      Pain Edu? --      Excl. in GC? --    Constitutional: Alert and oriented. Well appearing and  in no acute distress. Eyes: Conjunctivae are normal.  Head: Atraumatic. Nose: Minimal congestion/rhinnorhea.  TMs are dull bilaterally. Mouth/Throat: Mucous membranes are moist.  Oropharynx non-erythematous. Neck: No stridor.   Hematological/Lymphatic/Immunilogical: No cervical lymphadenopathy. Cardiovascular: Normal rate, regular rhythm. Grossly normal heart sounds.  Good peripheral circulation. Respiratory: Normal respiratory effort.  No retractions. Lungs CTAB. Gastrointestinal: Soft and nontender. No distention.  Musculoskeletal: His upper and lower extremities without any difficulty and normal gait was noted. Neurologic:  Normal speech and language. No gross focal neurologic deficits are appreciated.  Skin:  Skin is warm, dry and intact. No rash  noted. Psychiatric: Mood and affect are normal. Speech and behavior are normal.  ____________________________________________   LABS (all labs ordered are listed, but only abnormal results are displayed)  Labs Reviewed  GROUP A STREP BY PCR  INFLUENZA PANEL BY PCR (TYPE A & B)   ____________________________________________   PROCEDURES  Procedure(s) performed: None  Procedures  Critical Care performed: No  ____________________________________________   INITIAL IMPRESSION / ASSESSMENT AND PLAN / ED COURSE  Patient was made aware that both influenza and strep test were negative.  She is to continue with her medication at home and also continue with Tylenol or ibuprofen as needed for fever or body aches.  She is to increase fluids.  She will follow-up with her PCP if any continued problems or [facility] acute care. ____________________________________________   FINAL CLINICAL IMPRESSION(S) / ED DIAGNOSES  Final diagnoses:  Viral URI with cough     ED Discharge Orders    None       Note:  This document was prepared using Dragon voice recognition software and may include unintentional dictation errors.    Tommi Rumps, PA-C 07/17/17 1516    Emily Filbert, MD 07/17/17 1538

## 2017-07-17 NOTE — ED Notes (Signed)
See triage note  Presents with sore throat and subjective fever for 2 days. afebrile on arrival

## 2017-08-18 ENCOUNTER — Telehealth: Payer: Self-pay | Admitting: Physician Assistant

## 2017-08-18 NOTE — Telephone Encounter (Signed)
Faxed [phone] * [facility] * on 10.17.18

## 2017-11-16 ENCOUNTER — Emergency Department: Payer: Medicaid Other

## 2017-11-16 ENCOUNTER — Emergency Department
Admission: EM | Admit: 2017-11-16 | Discharge: 2017-11-16 | Disposition: A | Discharge status: Home / Self Care | Payer: Medicaid Other | Attending: Emergency Medicine | Admitting: Emergency Medicine

## 2017-11-16 ENCOUNTER — Other Ambulatory Visit: Payer: Self-pay

## 2017-11-16 ENCOUNTER — Encounter: Payer: Self-pay | Admitting: Emergency Medicine

## 2017-11-16 DIAGNOSIS — Y939 Activity, unspecified: Secondary | ICD-10-CM | POA: Insufficient documentation

## 2017-11-16 DIAGNOSIS — X500XXA Overexertion from strenuous movement or load, initial encounter: Secondary | ICD-10-CM | POA: Insufficient documentation

## 2017-11-16 DIAGNOSIS — Z79899 Other long term (current) drug therapy: Secondary | ICD-10-CM | POA: Insufficient documentation

## 2017-11-16 DIAGNOSIS — F17210 Nicotine dependence, cigarettes, uncomplicated: Secondary | ICD-10-CM | POA: Insufficient documentation

## 2017-11-16 DIAGNOSIS — Y999 Unspecified external cause status: Secondary | ICD-10-CM | POA: Insufficient documentation

## 2017-11-16 DIAGNOSIS — S93602A Unspecified sprain of left foot, initial encounter: Secondary | ICD-10-CM

## 2017-11-16 DIAGNOSIS — Y929 Unspecified place or not applicable: Secondary | ICD-10-CM | POA: Insufficient documentation

## 2017-11-16 MED ORDER — NABUMETONE 750 MG PO TABS: 750.0000 mg | ORAL_TABLET | Freq: Two times a day (BID) | ORAL | 0 refills | Status: DC

## 2017-11-16 NOTE — ED Triage Notes (Signed)
Pt states her foot rolled yesterday on the left, states pain on the lateral part of her foot, pt able to bear some weight, Swelling noted to left foot.

## 2017-11-16 NOTE — Discharge Instructions (Addendum)
Your exam and x-ray are consistent with a foot sprain. There is no evidence of a fracture or dislocation. Wear the post-op shoe to walk. Rest with the foot elevated and apply ice to reduce pain and swelling. Take the prescription meds as directed. Follow-up with Podiatry for ongoing symptoms.

## 2017-11-16 NOTE — ED Provider Notes (Signed)
[facility] Emergency Department Provider Note ____________________________________________  Time seen: 1513  I have reviewed the triage vital signs and the nursing notes.  HISTORY  Chief Complaint  Foot Pain  HPI Brittney [name] is a 27 y.o. female presents to the ED for evaluation of left foot pain.  Patient describes an injury yesterday where she rolled her left foot.  Since that time she is had pain to the lateral dorsal aspect of the left foot.  Patient is able to ambulate without significant difficulty but does note pain.  She denies any other injury at this time.  History reviewed. No pertinent past medical history.  Patient Active Problem List   Diagnosis Date Noted  . Anxiety and depression 03/26/2017  . Nexplanon in place 03/26/2017    Past Surgical History:  Procedure Laterality Date  . NO PAST SURGERIES      Prior to Admission medications   Medication Sig Start Date End Date Taking? Authorizing Provider  etonogestrel (NEXPLANON) 68 MG IMPL implant 1 each once by Subdermal route.    [provider]  nabumetone (RELAFEN) 750 MG tablet Take 1 tablet (750 mg total) by mouth 2 (two) times daily. 11/16/17   Joyce Heitman, Charlesetta IvoryJenise V Bacon, PA-C  sertraline (ZOLOFT) 50 MG tablet Take 1 tablet (50 mg total) daily by mouth. 03/25/17 06/23/17  Trey SailorsPollak, Adriana M, PA-C    Allergies Amoxicillin-pot clavulanate  Family History  Problem Relation Age of Onset  . Hypertension Mother   . Hypertension Father   . Asthma Brother   . Diabetes Maternal Grandmother   . Heart disease Maternal Grandfather   . Heart attack Maternal Aunt   . Asthma Daughter     Social History Social History   Tobacco Use  . Smoking status: Current Every Day Smoker    Packs/day: 0.50    Years: 4.00    Pack years: 2.00  . Smokeless tobacco: Never Used  Substance Use Topics  . Alcohol use: Yes    Alcohol/week: 7.8 oz    Types: 5 Cans of beer, 8 Shots of liquor per week  .  Drug use: Yes    Types: Marijuana    Review of Systems  Constitutional: Negative for fever. Cardiovascular: Negative for chest pain. Respiratory: Negative for shortness of breath. Musculoskeletal: Negative for back pain.  Left foot pain as above. Skin: Negative for rash. Neurological: Negative for headaches, focal weakness or numbness. ____________________________________________  PHYSICAL EXAM:  VITAL SIGNS: ED Triage Vitals  Enc Vitals Group     BP 11/16/17 1352 112/66     Pulse Rate 11/16/17 1352 84     Resp 11/16/17 1352 12     Temp 11/16/17 1352 98.2 F (36.8 C)     Temp Source 11/16/17 1352 Oral     SpO2 11/16/17 1352 96 %     Weight 11/16/17 1356 206 lb (93.4 kg)     Height 11/16/17 1352 5\' 6"  (1.676 m)     Head Circumference --      Peak Flow --      Pain Score 11/16/17 1356 6     Pain Loc --      Pain Edu? --      Excl. in GC? --     Constitutional: Alert and oriented. Well appearing and in no distress. Head: Normocephalic and atraumatic. Cardiovascular: Normal rate, regular rhythm. Normal distal pulses. Respiratory: Normal respiratory effort.  Musculoskeletal: Left foot without obvious deformity, dislocation, or joint effusion.  Patient is  tender to palpation to the lateral aspect of the foot over the navicular and the fifth metatarsal.  Ankle exam is benign with normal range of motion.  Nontender with normal range of motion in all extremities.  Neurologic: Antalgic gait without ataxia. Normal speech and language. No gross focal neurologic deficits are appreciated. Skin:  Skin is warm, dry and intact. No rash noted. ____________________________________________   RADIOLOGY  Left foot  Negative ____________________________________________  PROCEDURES  Procedures Postop shoe ____________________________________________  INITIAL IMPRESSION / ASSESSMENT AND PLAN / ED COURSE  Patient with ED evaluation of injury sustained following a mechanical fall  yesterday.  Patient describes rolling her left ankle and presents now with the lateral foot pain.  Denies any other injury.  She is reassured by her negative x-ray.  Exam is consistent with foot sprain she is placed in a postop shoe for comfort.  She will be discharged with a prescription for Relafen to dose as directed.  She is referred to podiatry for ongoing symptom management.  Work note was provided for 1 day as requested. ___________________________________________  FINAL CLINICAL IMPRESSION(S) / ED DIAGNOSES  Final diagnoses:  Sprain of left foot, initial encounter      Lissa Hoard, PA-C 11/16/17 1549    Dionne Bucy, MD 11/16/17 2056

## 2017-11-18 ENCOUNTER — Ambulatory Visit: Payer: Self-pay | Admitting: Family Medicine

## 2018-03-26 ENCOUNTER — Encounter: Payer: Medicaid Other | Admitting: Physician Assistant

## 2018-05-21 ENCOUNTER — Encounter: Payer: Medicaid Other | Admitting: Physician Assistant

## 2018-06-26 ENCOUNTER — Other Ambulatory Visit: Payer: Self-pay

## 2018-06-26 ENCOUNTER — Emergency Department
Admission: EM | Admit: 2018-06-26 | Discharge: 2018-06-26 | Disposition: A | Discharge status: Home / Self Care | Payer: Medicaid Other | Attending: Emergency Medicine | Admitting: Emergency Medicine

## 2018-06-26 ENCOUNTER — Encounter: Payer: Self-pay | Admitting: Emergency Medicine

## 2018-06-26 DIAGNOSIS — F17210 Nicotine dependence, cigarettes, uncomplicated: Secondary | ICD-10-CM | POA: Insufficient documentation

## 2018-06-26 DIAGNOSIS — R509 Fever, unspecified: Secondary | ICD-10-CM | POA: Insufficient documentation

## 2018-06-26 DIAGNOSIS — R05 Cough: Secondary | ICD-10-CM | POA: Insufficient documentation

## 2018-06-26 DIAGNOSIS — M791 Myalgia, unspecified site: Secondary | ICD-10-CM | POA: Insufficient documentation

## 2018-06-26 DIAGNOSIS — R6889 Other general symptoms and signs: Secondary | ICD-10-CM

## 2018-06-26 MED ORDER — IBUPROFEN 600 MG PO TABS: 600.0000 mg | ORAL_TABLET | Freq: Three times a day (TID) | ORAL | 0 refills | Status: DC | PRN

## 2018-06-26 MED ORDER — PSEUDOEPH-BROMPHEN-DM 30-2-10 MG/5ML PO SYRP: 5.0000 mL | ORAL_SOLUTION | Freq: Four times a day (QID) | ORAL | 0 refills | Status: DC | PRN

## 2018-06-26 MED ORDER — OSELTAMIVIR PHOSPHATE 75 MG PO CAPS: 75.0000 mg | ORAL_CAPSULE | Freq: Two times a day (BID) | ORAL | 0 refills | Status: AC

## 2018-06-26 NOTE — ED Provider Notes (Signed)
[facility] Emergency Department Provider Note   ____________________________________________   First MD Initiated Contact with Patient 06/26/18 1343     (approximate)  I have reviewed the triage vital signs and the nursing notes.   HISTORY  Chief Complaint Generalized Body Aches and Chills    HPI Brittney [name] is a 28 y.o. female patient presents with acute onset of generalized body aches, fever/chills, and a nonproductive cough.  Patient denies nausea, vomiting, diarrhea.  Patient not taken flu shot for this season.  Patient rates her pain discomfort as 8/10.  Patient described the pain as "achy".  No palliative measure for complaint.    History reviewed. No pertinent past medical history.  Patient Active Problem List   Diagnosis Date Noted  . Anxiety and depression 03/26/2017  . Nexplanon in place 03/26/2017    Past Surgical History:  Procedure Laterality Date  . NO PAST SURGERIES      Prior to Admission medications   Medication Sig Start Date End Date Taking? Authorizing Provider  brompheniramine-pseudoephedrine-DM 30-2-10 MG/5ML syrup Take 5 mLs by mouth 4 (four) times daily as needed. 06/26/18   Joni Reining, PA-C  etonogestrel (NEXPLANON) 68 MG IMPL implant 1 each once by Subdermal route.    [provider]  ibuprofen (ADVIL,MOTRIN) 600 MG tablet Take 1 tablet (600 mg total) by mouth every 8 (eight) hours as needed. 06/26/18   Joni Reining, PA-C  nabumetone (RELAFEN) 750 MG tablet Take 1 tablet (750 mg total) by mouth 2 (two) times daily. 11/16/17   Menshew, Charlesetta Ivory, PA-C  oseltamivir (TAMIFLU) 75 MG capsule Take 1 capsule (75 mg total) by mouth 2 (two) times daily for 5 days. 06/26/18 07/01/18  Joni Reining, PA-C  sertraline (ZOLOFT) 50 MG tablet Take 1 tablet (50 mg total) daily by mouth. 03/25/17 06/23/17  Trey Sailors, PA-C    Allergies Amoxicillin-pot clavulanate  Family History  Problem Relation Age of Onset   . Hypertension Mother   . Hypertension Father   . Asthma Brother   . Diabetes Maternal Grandmother   . Heart disease Maternal Grandfather   . Heart attack Maternal Aunt   . Asthma Daughter     Social History Social History   Tobacco Use  . Smoking status: Current Every Day Smoker    Packs/day: 0.50    Years: 4.00    Pack years: 2.00    Types: Cigarettes  . Smokeless tobacco: Never Used  Substance Use Topics  . Alcohol use: Yes  . Drug use: Yes    Types: Marijuana    Review of Systems Constitutional: No fever/chills Eyes: No visual changes. ENT: No sore throat. Cardiovascular: Denies chest pain. Respiratory: Denies shortness of breath. Gastrointestinal: No abdominal pain.  No nausea, no vomiting.  No diarrhea.  No constipation. Genitourinary: Negative for dysuria. Musculoskeletal: Negative for back pain. Skin: Negative for rash. Neurological: Negative for headaches, focal weakness or numbness. Psychiatric:  Anxiety and depression. Endocrine:  Allergic/Immunilogical: Augmentin ____________________________________________   PHYSICAL EXAM:  VITAL SIGNS: ED Triage Vitals  Enc Vitals Group     BP 06/26/18 1302 120/71     Pulse Rate 06/26/18 1302 (!) 112     Resp 06/26/18 1302 16     Temp 06/26/18 1302 99.3 F (37.4 C)     Temp Source 06/26/18 1302 Oral     SpO2 06/26/18 1302 96 %     Weight 06/26/18 1303 205 lb (93 kg)  Height 06/26/18 1303 5\' 7"  (1.702 m)     Head Circumference --      Peak Flow --      Pain Score 06/26/18 1303 8     Pain Loc --      Pain Edu? --      Excl. in GC? --     Constitutional: Alert and oriented. Well appearing and in no acute distress. Eyes: Conjunctivae are normal. PERRL. EOMI. Head: Atraumatic. Nose: Bilateral maxillary guarding, edematous nasal turbinates, and clear rhinorrhea.. Mouth/Throat: Mucous membranes are moist.  Oropharynx non-erythematous.  Postnasal drainage. Neck: No stridor.     Hematological/Lymphatic/Immunilogical: No cervical lymphadenopathy. Cardiovascular: Normal rate, regular rhythm. Grossly normal heart sounds.  Good peripheral circulation. Respiratory: Normal respiratory effort.  No retractions. Lungs CTAB. Gastrointestinal: Soft and nontender. No distention. No abdominal bruits. No CVA tenderness. Skin:  Skin is warm, dry and intact. No rash noted. Psychiatric: Mood and affect are normal. Speech and behavior are normal.  ____________________________________________   LABS (all labs ordered are listed, but only abnormal results are displayed)  Labs Reviewed - No data to display ____________________________________________  EKG   ____________________________________________  RADIOLOGY  ED MD interpretation:    Official radiology report(s): No results found.  ____________________________________________   PROCEDURES  Procedure(s) performed: None  Procedures  Critical Care performed: No  ____________________________________________   INITIAL IMPRESSION / ASSESSMENT AND PLAN / ED COURSE  As part of my medical decision making, I reviewed the following data within the electronic MEDICAL RECORD NUMBER     Patient presents with generalized body aches, fever/chills, and cough.  Feels exam is consistent with viral respiratory infection.  Patient given discharge care instruction advised take medication as directed.      ____________________________________________   FINAL CLINICAL IMPRESSION(S) / ED DIAGNOSES  Final diagnoses:  Flu-like symptoms     ED Discharge Orders         Ordered    oseltamivir (TAMIFLU) 75 MG capsule  2 times daily     06/26/18 1356    brompheniramine-pseudoephedrine-DM 30-2-10 MG/5ML syrup  4 times daily PRN     06/26/18 1356    ibuprofen (ADVIL,MOTRIN) 600 MG tablet  Every 8 hours PRN     06/26/18 1356           Note:  This document was prepared using Dragon voice recognition software and may  include unintentional dictation errors.    Joni Reining, PA-C 06/26/18 1401    Schaevitz, Myra Rude, MD 06/26/18 [phone]

## 2018-06-26 NOTE — ED Triage Notes (Signed)
Pt in via POV with complaints of generalized body aches and chills since last night.  Pt with low grade fever upon arrival, NAD noted at this time.

## 2018-06-26 NOTE — ED Notes (Signed)
See triage note  Presents with body aches,chills and sore throat   States subjective fever  Low grade temp noted on arrival  Sx's started last pm

## 2020-06-18 DIAGNOSIS — Z20822 Contact with and (suspected) exposure to covid-19: Secondary | ICD-10-CM | POA: Diagnosis not present

## 2020-12-26 ENCOUNTER — Encounter: Payer: Medicaid Other | Admitting: Family Medicine

## 2021-01-16 ENCOUNTER — Encounter: Payer: Medicaid Other | Admitting: Family Medicine

## 2021-10-25 ENCOUNTER — Ambulatory Visit: Payer: Medicaid Other | Admitting: Physician Assistant

## 2022-06-26 ENCOUNTER — Ambulatory Visit (INDEPENDENT_AMBULATORY_CARE_PROVIDER_SITE_OTHER): Payer: Medicaid Other | Admitting: Family Medicine

## 2022-06-26 ENCOUNTER — Encounter: Payer: Self-pay | Admitting: Family Medicine

## 2022-06-26 VITALS — BP 117/68 | HR 78 | Temp 98.3°F | Ht 67.0 in | Wt 249.0 lb

## 2022-06-26 DIAGNOSIS — Z6839 Body mass index (BMI) 39.0-39.9, adult: Secondary | ICD-10-CM | POA: Diagnosis not present

## 2022-06-26 DIAGNOSIS — F17210 Nicotine dependence, cigarettes, uncomplicated: Secondary | ICD-10-CM

## 2022-06-26 DIAGNOSIS — R51 Headache with orthostatic component, not elsewhere classified: Secondary | ICD-10-CM | POA: Diagnosis not present

## 2022-06-26 DIAGNOSIS — R601 Generalized edema: Secondary | ICD-10-CM | POA: Diagnosis not present

## 2022-06-26 DIAGNOSIS — F1220 Cannabis dependence, uncomplicated: Secondary | ICD-10-CM | POA: Diagnosis not present

## 2022-06-26 DIAGNOSIS — Z1159 Encounter for screening for other viral diseases: Secondary | ICD-10-CM | POA: Diagnosis not present

## 2022-06-26 DIAGNOSIS — Z114 Encounter for screening for human immunodeficiency virus [HIV]: Secondary | ICD-10-CM

## 2022-06-26 DIAGNOSIS — Z136 Encounter for screening for cardiovascular disorders: Secondary | ICD-10-CM

## 2022-06-26 DIAGNOSIS — F411 Generalized anxiety disorder: Secondary | ICD-10-CM

## 2022-06-26 DIAGNOSIS — Z1329 Encounter for screening for other suspected endocrine disorder: Secondary | ICD-10-CM

## 2022-06-26 DIAGNOSIS — Z62810 Personal history of physical and sexual abuse in childhood: Secondary | ICD-10-CM

## 2022-06-26 DIAGNOSIS — L732 Hidradenitis suppurativa: Secondary | ICD-10-CM

## 2022-06-26 DIAGNOSIS — Z131 Encounter for screening for diabetes mellitus: Secondary | ICD-10-CM

## 2022-06-26 DIAGNOSIS — R7303 Prediabetes: Secondary | ICD-10-CM | POA: Diagnosis not present

## 2022-06-26 DIAGNOSIS — F339 Major depressive disorder, recurrent, unspecified: Secondary | ICD-10-CM | POA: Insufficient documentation

## 2022-06-26 DIAGNOSIS — F332 Major depressive disorder, recurrent severe without psychotic features: Secondary | ICD-10-CM | POA: Diagnosis not present

## 2022-06-26 DIAGNOSIS — Z13220 Encounter for screening for lipoid disorders: Secondary | ICD-10-CM

## 2022-06-26 DIAGNOSIS — E66812 Obesity, class 2: Secondary | ICD-10-CM | POA: Insufficient documentation

## 2022-06-26 MED ORDER — TRAZODONE HCL 50 MG PO TABS: ORAL_TABLET | ORAL | 1 refills | Status: DC

## 2022-06-26 MED ORDER — DOXYCYCLINE HYCLATE 100 MG PO TABS: 100.0000 mg | ORAL_TABLET | Freq: Two times a day (BID) | ORAL | 2 refills | Status: DC

## 2022-06-26 MED ORDER — BUPROPION HCL ER (XL) 150 MG PO TB24: 150.0000 mg | ORAL_TABLET | Freq: Every day | ORAL | 2 refills | Status: DC

## 2022-06-26 NOTE — Assessment & Plan Note (Signed)
Chronic, stable Body mass index is 39 kg/m. Pt notes hx of severe depression, recurrent, with present symptoms as well Discussed importance of healthy weight management Discussed diet and exercise
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
## 2022-06-26 NOTE — Assessment & Plan Note (Signed)
Chronic, worsened at end of day Normal temperature and pulses BP stable Continue to monitor- discussed multitude of causes Encouraged compression hose and increased water intake to assist initially
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
## 2022-06-26 NOTE — Assessment & Plan Note (Signed)
Chronic, variable Notes stress from relationship, family and social obligations Is in a long term relationship Lives with her bf Has 2 kids, 17 and almost 69; however, they are living with her parents (one with each) Her parents were not married- however, they have now remarried and about 3 years ago she asked for them to step in to assist her with children as her mood symptoms were so severe she could not care for herself

## 2022-06-26 NOTE — Assessment & Plan Note (Signed)
Reports daily use of MJ to "not feel anything" Reports that she and her kids have been through "a lot" Patient difficulty in finding the words to discuss what she has been through and next steps to seek care for herself to regain care of her children

## 2022-06-26 NOTE — Assessment & Plan Note (Addendum)
Chronic, severe, notes poor thoughts of herself and concern for her role on this planet Has reached out to family members to provide guardianship for children; however, pt notes this is not legally binding Recommend start of medication today; limited given insurance formulary Recommend Vit D screening to assist as often concurrent  Contracted for safety; denies SI or HI

## 2022-06-26 NOTE — Assessment & Plan Note (Signed)
Chronic, since ~32 years old  1/2 ppd Encouraged to try 7 mg patch to allow only 3 cigs/day or use of 1-2 mg lozenges or gums Discussed possibility that Wellbutrin may assist as well

## 2022-06-26 NOTE — Assessment & Plan Note (Signed)
Chronic, worsening Notes cysts that have been present for 2+ years of various states on abdominal folds, axilla, groin, thighs Not improved with previous treatment Trial of low dose doxy 100 mg BID to assist Close f/u recommended

## 2022-06-26 NOTE — Assessment & Plan Note (Signed)
Encourage increased fluids to assist- likely dehydrated given complaints of HA with bearing down in restroom, cough or sneeze Denies syncope

## 2022-06-26 NOTE — Assessment & Plan Note (Signed)
Recommend DM screening given obesity Continue to recommend balanced, lower carb meals. Smaller meal size, adding snacks. Choosing water as drink of choice and increasing purposeful exercise.

## 2022-06-27 ENCOUNTER — Telehealth: Payer: Self-pay | Admitting: Family Medicine

## 2022-06-27 ENCOUNTER — Telehealth: Payer: Self-pay

## 2022-06-27 ENCOUNTER — Other Ambulatory Visit: Payer: Self-pay | Admitting: Family Medicine

## 2022-06-27 LAB — CBC WITH DIFFERENTIAL/PLATELET
Basophils Absolute: 0.1 10*3/uL (ref 0.0–0.2)
Basos: 1 %
EOS (ABSOLUTE): 0.2 10*3/uL (ref 0.0–0.4)
Eos: 2 %
Hematocrit: 39.8 % (ref 34.0–46.6)
Hemoglobin: 12.9 g/dL (ref 11.1–15.9)
Immature Grans (Abs): 0 10*3/uL (ref 0.0–0.1)
Immature Granulocytes: 0 %
Lymphocytes Absolute: 3.8 10*3/uL — ABNORMAL HIGH (ref 0.7–3.1)
Lymphs: 36 %
MCH: 27.5 pg (ref 26.6–33.0)
MCHC: 32.4 g/dL (ref 31.5–35.7)
MCV: 85 fL (ref 79–97)
Monocytes Absolute: 0.8 10*3/uL (ref 0.1–0.9)
Monocytes: 7 %
Neutrophils Absolute: 5.7 10*3/uL (ref 1.4–7.0)
Neutrophils: 54 %
Platelets: 270 10*3/uL (ref 150–450)
RBC: 4.69 x10E6/uL (ref 3.77–5.28)
RDW: 12.2 % (ref 11.7–15.4)
WBC: 10.5 10*3/uL (ref 3.4–10.8)

## 2022-06-27 LAB — TSH+FREE T4
Free T4: 1.3 ng/dL (ref 0.82–1.77)
TSH: 1.75 u[IU]/mL (ref 0.450–4.500)

## 2022-06-27 LAB — COMPREHENSIVE METABOLIC PANEL
ALT: 17 IU/L (ref 0–32)
AST: 16 IU/L (ref 0–40)
Albumin/Globulin Ratio: 1.7 (ref 1.2–2.2)
Albumin: 4.7 g/dL (ref 3.9–4.9)
Alkaline Phosphatase: 104 IU/L (ref 44–121)
BUN/Creatinine Ratio: 19 (ref 9–23)
BUN: 14 mg/dL (ref 6–20)
Bilirubin Total: 0.3 mg/dL (ref 0.0–1.2)
CO2: 23 mmol/L (ref 20–29)
Calcium: 9.4 mg/dL (ref 8.7–10.2)
Chloride: 101 mmol/L (ref 96–106)
Creatinine, Ser: 0.73 mg/dL (ref 0.57–1.00)
Globulin, Total: 2.7 g/dL (ref 1.5–4.5)
Glucose: 93 mg/dL (ref 70–99)
Potassium: 4.4 mmol/L (ref 3.5–5.2)
Sodium: 138 mmol/L (ref 134–144)
Total Protein: 7.4 g/dL (ref 6.0–8.5)
eGFR: 112 mL/min/{1.73_m2} (ref >59)

## 2022-06-27 LAB — HEMOGLOBIN A1C
Est. average glucose Bld gHb Est-mCnc: 117 mg/dL
Hgb A1c MFr Bld: 5.7 % — ABNORMAL HIGH (ref 4.8–5.6)

## 2022-06-27 LAB — LIPID PANEL
Chol/HDL Ratio: 3.6 ratio (ref 0.0–4.4)
Cholesterol, Total: 153 mg/dL (ref 100–199)
HDL: 42 mg/dL (ref >39)
LDL Chol Calc (NIH): 94 mg/dL (ref 0–99)
Triglycerides: 91 mg/dL (ref 0–149)
VLDL Cholesterol Cal: 17 mg/dL (ref 5–40)

## 2022-06-27 LAB — HEPATITIS C ANTIBODY: Hep C Virus Ab: NONREACTIVE

## 2022-06-27 LAB — HIV ANTIBODY (ROUTINE TESTING W REFLEX): HIV Screen 4th Generation wRfx: NONREACTIVE

## 2022-06-27 LAB — VITAMIN D 25 HYDROXY (VIT D DEFICIENCY, FRACTURES): Vit D, 25-Hydroxy: 13.5 ng/mL — ABNORMAL LOW (ref 30.0–100.0)

## 2022-06-27 MED ORDER — VITAMIN D (ERGOCALCIFEROL) 1.25 MG (50000 UNIT) PO CAPS: 50000.0000 [IU] | ORAL_CAPSULE | ORAL | 0 refills | Status: DC

## 2022-06-27 NOTE — Progress Notes (Signed)
Labs have been reviewed -confirm pre-diabetes. Continue to recommend balanced, lower carb meals. Smaller meal size, adding snacks. Choosing water as drink of choice and increasing purposeful exercise. -low Vit D. Recommend high dose weekly supplement- as Rx. All other labs are normal and stable. Gwyneth Sprout, [place] [address] [place], [place] [address] [phone] (phone) [phone] (fax) [place]

## 2022-06-27 NOTE — Telephone Encounter (Signed)
Brittney [name] I called this patient regarding her labs and she mentioned this message.  I explained to her that we would not be able to write anything unless her pet was a certified service animal.  She said she was going to takes steps to get it certified.   She was advised to let us know once she has completed        Copied from [place]. Topic: General - Other >> Jun 27, 2022  9:31 AM Cyndi Bender wrote: Reason for CRM: Pt stated she had an appt yesterday and would like to request a return call to discuss getting an emotional support animal.

## 2022-06-28 ENCOUNTER — Other Ambulatory Visit: Payer: Self-pay | Admitting: Family Medicine

## 2022-07-02 DIAGNOSIS — H1031 Unspecified acute conjunctivitis, right eye: Secondary | ICD-10-CM | POA: Diagnosis not present

## 2022-07-08 NOTE — Progress Notes (Unsigned)
I,Lamis Behrmann R Rahsaan Weakland,acting as a Education administrator for Gwyneth Sprout, FNP.,have documented all relevant documentation on the behalf of Gwyneth Sprout, FNP,as directed by  Gwyneth Sprout, FNP while in the presence of Gwyneth Sprout, FNP.  Established patient visit  Patient: Brittney [name]   DOB: [birth year]   32 y.o. Female  MRN: [id number] Visit Date: 07/09/2022  Today's healthcare provider: Gwyneth Sprout, FNP  Re Introduced to nurse practitioner role and practice setting.  All questions answered.  Discussed provider/patient relationship and expectations.  Subjective    HPI  Follow up for Medication management   The patient was last seen for this 2 weeks ago. Changes made at last visit include Relafen '750mg'$  She reports excellent compliance with treatment. She feels that condition is Unchanged. She is having side effects. Patient said she took the medication Friday and vomited all day. Patient states she also has a new "bump down there"   -----------------------------------------------------------------------------------------  Medications: Outpatient Medications Prior to Visit  Medication Sig   buPROPion (WELLBUTRIN XL) 150 MG 24 hr tablet Take 1 tablet (150 mg total) by mouth daily.   etonogestrel (NEXPLANON) 68 MG IMPL implant 1 each once by Subdermal route.   ibuprofen (ADVIL,MOTRIN) 600 MG tablet Take 1 tablet (600 mg total) by mouth every 8 (eight) hours as needed.   nabumetone (RELAFEN) 750 MG tablet Take 1 tablet (750 mg total) by mouth 2 (two) times daily.   traZODone (DESYREL) 50 MG tablet Use of 1-2 tablets as needed for sleep/night time relaxation   Vitamin D, Ergocalciferol, (DRISDOL) 1.25 MG (50000 UNIT) CAPS capsule Take 1 capsule (50,000 Units total) by mouth every 7 (seven) days.   [DISCONTINUED] doxycycline (VIBRA-TABS) 100 MG tablet Take 1 tablet (100 mg total) by mouth 2 (two) times daily.   No facility-administered medications prior to visit.    Review of Systems      Objective    BP 113/66 (BP Location: Right Arm, Patient Position: Sitting, Cuff Size: Normal)   Pulse 83   Temp 97.8 F (36.6 C) (Oral)   Wt 249 lb (112.9 kg)   SpO2 100%   BMI 39.00 kg/m    Physical Exam Vitals and nursing note reviewed.  Constitutional:      General: She is not in acute distress.    Appearance: Normal appearance. She is obese. She is not ill-appearing, toxic-appearing or diaphoretic.  HENT:     Head: Normocephalic and atraumatic.  Cardiovascular:     Rate and Rhythm: Normal rate and regular rhythm.     Pulses: Normal pulses.     Heart sounds: Normal heart sounds. No murmur heard.    No friction rub. No gallop.  Pulmonary:     Effort: Pulmonary effort is normal. No respiratory distress.     Breath sounds: Normal breath sounds. No stridor. No wheezing, rhonchi or rales.  Chest:     Chest wall: No tenderness.  Musculoskeletal:        General: No swelling, tenderness, deformity or signs of injury. Normal range of motion.     Right lower leg: No edema.     Left lower leg: No edema.  Skin:    General: Skin is warm and dry.     Capillary Refill: Capillary refill takes less than 2 seconds.     Coloration: Skin is not jaundiced or pale.     Findings: Lesion present. No bruising, erythema or rash.     Comments: HS lesions present; improved with  doxy in axilla; remain on abdominal folds and inner thighs. Reports GI concerns with use as well as yeast infection. Will change to anti-androgenic medication for further assistance; referral placed to derm  Neurological:     General: No focal deficit present.     Mental Status: She is alert and oriented to person, place, and time. Mental status is at baseline.     Cranial Nerves: No cranial nerve deficit.     Sensory: No sensory deficit.     Motor: No weakness.     Coordination: Coordination normal.  Psychiatric:        Mood and Affect: Mood normal.        Behavior: Behavior normal.        Thought Content: Thought  content normal.        Judgment: Judgment normal.    No results found for any visits on 07/09/22.  Assessment & Plan     Problem List Items Addressed This Visit       Musculoskeletal and Integument   Hidradenitis suppurativa - Primary    HS lesions present; improved with doxy in axilla; remain on abdominal folds and inner thighs. Reports GI concerns with use as well as yeast infection. Will change to anti-androgenic medication for further assistance; referral placed to derm      Relevant Medications   fluconazole (DIFLUCAN) 150 MG tablet   Other Relevant Orders   Ambulatory referral to Dermatology     Other   Antibiotic-induced yeast infection    Acute, self limiting Recommend diflucan to assist       Relevant Medications   fluconazole (DIFLUCAN) 150 MG tablet   Class 2 severe obesity due to excess calories with serious comorbidity and body mass index (BMI) of 39.0 to 39.9 in adult [facility])    Chronic, unchanged within 2 weeks of wellbutrin Continue medication to assist Body mass index is 39 kg/m. Discussed importance of healthy weight management Discussed diet and exercise       GAD (generalized anxiety disorder)    Chronic, stable Continue wellbutrin 150 to assist at this time Discussed referrals to psych with use of psychology today and filters      Marijuana dependence ([place])    Chronic, stable Continue to monitor  Declines assistance with cessation at this time       Psychophysiological insomnia    Chronic, improved Continue trazodone to assist 50 mg      Severe episode of recurrent major depressive disorder, without psychotic features (HCC)    Chronic, stable Continue wellbutrin and trazodone Discussed next steps with counseling       Tobacco dependence due to cigarettes    Chronic, stable Declines cessation efforts at this time CTM      Return in about 4 weeks (around 08/06/2022), or if symptoms worsen or fail to improve.     Vonna Kotyk,  FNP, have reviewed all documentation for this visit. The documentation on 07/09/22 for the exam, diagnosis, procedures, and orders are all accurate and complete.  Gwyneth Sprout, [place] [phone] (phone) [phone] (fax)  [place]

## 2022-07-09 ENCOUNTER — Encounter: Payer: Self-pay | Admitting: Family Medicine

## 2022-07-09 ENCOUNTER — Ambulatory Visit (INDEPENDENT_AMBULATORY_CARE_PROVIDER_SITE_OTHER): Payer: Medicaid Other | Admitting: Family Medicine

## 2022-07-09 VITALS — BP 113/66 | HR 83 | Temp 97.8°F | Wt 249.0 lb

## 2022-07-09 DIAGNOSIS — F332 Major depressive disorder, recurrent severe without psychotic features: Secondary | ICD-10-CM

## 2022-07-09 DIAGNOSIS — L732 Hidradenitis suppurativa: Secondary | ICD-10-CM | POA: Diagnosis not present

## 2022-07-09 DIAGNOSIS — B379 Candidiasis, unspecified: Secondary | ICD-10-CM

## 2022-07-09 DIAGNOSIS — F411 Generalized anxiety disorder: Secondary | ICD-10-CM | POA: Diagnosis not present

## 2022-07-09 DIAGNOSIS — Z6839 Body mass index (BMI) 39.0-39.9, adult: Secondary | ICD-10-CM

## 2022-07-09 DIAGNOSIS — F17210 Nicotine dependence, cigarettes, uncomplicated: Secondary | ICD-10-CM

## 2022-07-09 DIAGNOSIS — F1220 Cannabis dependence, uncomplicated: Secondary | ICD-10-CM

## 2022-07-09 DIAGNOSIS — F5104 Psychophysiologic insomnia: Secondary | ICD-10-CM | POA: Insufficient documentation

## 2022-07-09 DIAGNOSIS — T3695XA Adverse effect of unspecified systemic antibiotic, initial encounter: Secondary | ICD-10-CM | POA: Insufficient documentation

## 2022-07-09 MED ORDER — SPIRONOLACTONE 50 MG PO TABS: 100.0000 mg | ORAL_TABLET | Freq: Two times a day (BID) | ORAL | 0 refills | Status: DC

## 2022-07-09 MED ORDER — FLUCONAZOLE 150 MG PO TABS: ORAL_TABLET | ORAL | 0 refills | Status: DC

## 2022-07-09 NOTE — Assessment & Plan Note (Signed)
HS lesions present; improved with doxy in axilla; remain on abdominal folds and inner thighs. Reports GI concerns with use as well as yeast infection. Will change to anti-androgenic medication for further assistance; referral placed to derm

## 2022-07-09 NOTE — Assessment & Plan Note (Signed)
Chronic, improved Continue trazodone to assist 50 mg

## 2022-07-09 NOTE — Assessment & Plan Note (Signed)
Acute, self limiting Recommend diflucan to assist

## 2022-07-09 NOTE — Assessment & Plan Note (Signed)
Chronic, stable Continue wellbutrin and trazodone Discussed next steps with counseling

## 2022-07-09 NOTE — Assessment & Plan Note (Signed)
Chronic, unchanged within 2 weeks of wellbutrin Continue medication to assist Body mass index is 39 kg/m. Discussed importance of healthy weight management Discussed diet and exercise

## 2022-07-09 NOTE — Assessment & Plan Note (Signed)
Chronic, stable Continue to monitor  Declines assistance with cessation at this time

## 2022-07-09 NOTE — Assessment & Plan Note (Signed)
Chronic, stable Declines cessation efforts at this time CTM

## 2022-07-09 NOTE — Assessment & Plan Note (Signed)
Chronic, stable Continue wellbutrin 150 to assist at this time Discussed referrals to psych with use of psychology today and filters

## 2022-08-06 ENCOUNTER — Other Ambulatory Visit: Payer: Self-pay | Admitting: Family Medicine

## 2022-08-06 NOTE — Telephone Encounter (Addendum)
Brittney [name] from [facility] stated she would like to speak with a member of clinical staff to verify that the emotional support letter was written by a provider.  Stated just needs verification, yes or no .  Please advise.

## 2022-08-07 NOTE — Telephone Encounter (Signed)
Left message on voicemail confirming that Daneil Dan did provide letter to patient.

## 2022-08-09 ENCOUNTER — Ambulatory Visit: Payer: Medicaid Other | Admitting: Family Medicine

## 2022-08-13 ENCOUNTER — Encounter: Payer: Self-pay | Admitting: Family Medicine

## 2022-08-13 ENCOUNTER — Ambulatory Visit (INDEPENDENT_AMBULATORY_CARE_PROVIDER_SITE_OTHER): Payer: Medicaid Other | Admitting: Family Medicine

## 2022-08-13 DIAGNOSIS — F1220 Cannabis dependence, uncomplicated: Secondary | ICD-10-CM | POA: Diagnosis not present

## 2022-08-13 DIAGNOSIS — Z6839 Body mass index (BMI) 39.0-39.9, adult: Secondary | ICD-10-CM

## 2022-08-13 DIAGNOSIS — F332 Major depressive disorder, recurrent severe without psychotic features: Secondary | ICD-10-CM

## 2022-08-13 MED ORDER — BUPROPION HCL ER (XL) 300 MG PO TB24: 300.0000 mg | ORAL_TABLET | Freq: Every day | ORAL | 1 refills | Status: DC

## 2022-08-13 MED ORDER — HYDROXYZINE PAMOATE 25 MG PO CAPS: 25.0000 mg | ORAL_CAPSULE | Freq: Three times a day (TID) | ORAL | 0 refills | Status: DC | PRN

## 2022-08-13 NOTE — Progress Notes (Signed)
I,Joseline E Rosas,acting as a scribe for Gwyneth Sprout, FNP.,have documented all relevant documentation on the behalf of Gwyneth Sprout, FNP,as directed by  Gwyneth Sprout, FNP while in the presence of Gwyneth Sprout, FNP.   Established patient visit  Patient: Brittney [name]   DOB: 1990/12/17   32 y.o. Female  MRN: [id number] Visit Date: 08/13/2022  Today's healthcare provider: Gwyneth Sprout, FNP  Re Introduced to nurse practitioner role and practice setting.  All questions answered.  Discussed provider/patient relationship and expectations.  Chief Complaint  Patient presents with   Depression and Anxiety follow-up   Subjective    HPI  Depression, Follow-up  She  was last seen for this 6 weeks ago. Changes made at last visit include start bupropion and trazodone.  Patient is scheduled to see psychiatry 09/19/2022   She reports excellent compliance with treatment. She is not having side effects.   Current symptoms include: depressed mood, difficulty concentrating, fatigue, feelings of worthlessness/guilt, hopelessness, recurrent thoughts of death, suicidal thoughts without plan, and sleeping too much She feels she is Unchanged since last visit.     08/13/2022    1:45 PM 07/09/2022    4:08 PM 06/26/2022   10:35 AM  Depression screen PHQ 2/9  Decreased Interest 3 2 3   Down, Depressed, Hopeless 3 3 2   PHQ - 2 Score 6 5 5   Altered sleeping 3 2 1   Tired, decreased energy 3 3 3   Change in appetite 0 3 0  Feeling bad or failure about yourself  3 3 2   Trouble concentrating 3 3 3   Moving slowly or fidgety/restless 2 2 0  Suicidal thoughts 1 1 0  PHQ-9 Score 21 22 14   Difficult doing work/chores Very difficult Extremely dIfficult Not difficult at all    ----------------------------------------------------------------------------------------- Anxiety, Follow-up  She was last seen for anxiety 6 weeks ago. Changes made at last visit include start bupropion and trazodone.   She  reports excellent compliance with treatment.  She feels her anxiety is mild and Improved since last visit.  Symptoms: No chest pain Yes difficulty concentrating  No dizziness Yes fatigue  Yes feelings of losing control No insomnia  Yes irritable No palpitations  Yes panic attacks-once a week. Yes racing thoughts  No shortness of breath No sweating  Yes tremors/shakes-Last week    GAD-7 Results    08/13/2022    1:46 PM  GAD-7 Generalized Anxiety Disorder Screening Tool  1. Feeling Nervous, Anxious, or on Edge 3  2. Not Being Able to Stop or Control Worrying 3  3. Worrying Too Much About Different Things 3  4. Trouble Relaxing 3  5. Being So Restless it's Hard To Sit Still 2  6. Becoming Easily Annoyed or Irritable 3  7. Feeling Afraid As If Something Awful Might Happen 3  Total GAD-7 Score 20  Difficulty At Work, Home, or Getting  Along With Others? Extremely difficult    PHQ-9 Scores    08/13/2022    1:45 PM 07/09/2022    4:08 PM 06/26/2022   10:35 AM  PHQ9 SCORE ONLY  PHQ-9 Total Score 21 22 14     ---------------------------------------------------------------------------------------------------   Medications: Outpatient Medications Prior to Visit  Medication Sig   etonogestrel (NEXPLANON) 68 MG IMPL implant 1 each once by Subdermal route.   fluconazole (DIFLUCAN) 150 MG tablet Take for vaginal yeast complaints; take 1 tablet PO, repeat if s/s are not improved in 4 days.   ibuprofen (ADVIL,MOTRIN)  600 MG tablet Take 1 tablet (600 mg total) by mouth every 8 (eight) hours as needed.   nabumetone (RELAFEN) 750 MG tablet Take 1 tablet (750 mg total) by mouth 2 (two) times daily.   spironolactone (ALDACTONE) 50 MG tablet Take 2 tablets (100 mg total) by mouth 2 (two) times daily.   traZODone (DESYREL) 50 MG tablet Use of 1-2 tablets as needed for sleep/night time relaxation   Vitamin D, Ergocalciferol, (DRISDOL) 1.25 MG (50000 UNIT) CAPS capsule Take 1 capsule (50,000 Units  total) by mouth every 7 (seven) days.   [DISCONTINUED] buPROPion (WELLBUTRIN XL) 150 MG 24 hr tablet Take 1 tablet (150 mg total) by mouth daily.   No facility-administered medications prior to visit.   Review of Systems    Objective    BP 116/77 (BP Location: Right Arm, Patient Position: Sitting, Cuff Size: Large)   Pulse 81   Resp 16   Wt 250 lb 11.2 oz (113.7 kg)   BMI 39.27 kg/m   Physical Exam Vitals and nursing note reviewed.  Constitutional:      General: She is not in acute distress.    Appearance: Normal appearance. She is obese. She is not ill-appearing, toxic-appearing or diaphoretic.  HENT:     Head: Normocephalic and atraumatic.  Cardiovascular:     Rate and Rhythm: Normal rate and regular rhythm.     Pulses: Normal pulses.     Heart sounds: Normal heart sounds. No murmur heard.    No friction rub. No gallop.  Pulmonary:     Effort: Pulmonary effort is normal. No respiratory distress.     Breath sounds: Normal breath sounds. No stridor. No wheezing, rhonchi or rales.  Chest:     Chest wall: No tenderness.  Musculoskeletal:        General: No swelling, tenderness, deformity or signs of injury. Normal range of motion.     Right lower leg: No edema.     Left lower leg: No edema.  Skin:    General: Skin is warm and dry.     Capillary Refill: Capillary refill takes less than 2 seconds.     Coloration: Skin is not jaundiced or pale.     Findings: No bruising, erythema, lesion or rash.  Neurological:     General: No focal deficit present.     Mental Status: She is alert and oriented to person, place, and time. Mental status is at baseline.     Cranial Nerves: No cranial nerve deficit.     Sensory: No sensory deficit.     Motor: No weakness.     Coordination: Coordination normal.  Psychiatric:        Mood and Affect: Mood normal.        Behavior: Behavior normal.        Thought Content: Thought content normal.        Judgment: Judgment normal.     No  results found for any visits on 08/13/22.  Assessment & Plan     Problem List Items Addressed This Visit       Other   Class 2 severe obesity due to excess calories with serious comorbidity and body mass index (BMI) of 39.0 to 39.9 in adult    Chronic, stable Discussed importance of healthy weight management Discussed diet and exercise Body mass index is 39.27 kg/m.       Marijuana dependence    Chronic, stable Continue to recommend reduction to prevent lung damage Use of wellbutrin  to assist; will titrate to 300 mg       Severe episode of recurrent major depressive disorder, without psychotic features    Chronic, stable with recent mood provoking episode Patient notes increase in tearfulness following episode Add atarax PRN to assist       Relevant Medications   buPROPion (WELLBUTRIN XL) 300 MG 24 hr tablet   hydrOXYzine (VISTARIL) 25 MG capsule   Return in about 6 weeks (around 09/24/2022) for chonic disease management.     Vonna Kotyk, FNP, have reviewed all documentation for this visit. The documentation on 08/13/22 for the exam, diagnosis, procedures, and orders are all accurate and complete.  Gwyneth Sprout, [place] [phone] (phone) [phone] (fax)  [place]

## 2022-08-13 NOTE — Assessment & Plan Note (Signed)
Chronic, stable with recent mood provoking episode Patient notes increase in tearfulness following episode Add atarax PRN to assist

## 2022-08-13 NOTE — Assessment & Plan Note (Signed)
Chronic, stable Continue to recommend reduction to prevent lung damage Use of wellbutrin to assist; will titrate to 300 mg

## 2022-08-13 NOTE — Assessment & Plan Note (Signed)
Chronic, stable Discussed importance of healthy weight management Discussed diet and exercise Body mass index is 39.27 kg/m.

## 2022-09-19 ENCOUNTER — Other Ambulatory Visit
Admission: RE | Admit: 2022-09-19 | Discharge: 2022-09-19 | Disposition: A | Discharge status: Home / Self Care | Payer: Medicaid Other | Attending: Psychiatry | Admitting: Psychiatry

## 2022-09-19 ENCOUNTER — Encounter: Payer: Self-pay | Admitting: Psychiatry

## 2022-09-19 ENCOUNTER — Ambulatory Visit (INDEPENDENT_AMBULATORY_CARE_PROVIDER_SITE_OTHER): Payer: Medicaid Other | Admitting: Psychiatry

## 2022-09-19 VITALS — BP 118/70 | HR 86 | Temp 98.7°F | Ht 67.0 in | Wt 252.4 lb

## 2022-09-19 DIAGNOSIS — F329 Major depressive disorder, single episode, unspecified: Secondary | ICD-10-CM

## 2022-09-19 DIAGNOSIS — F4310 Post-traumatic stress disorder, unspecified: Secondary | ICD-10-CM | POA: Diagnosis present

## 2022-09-19 DIAGNOSIS — G4730 Sleep apnea, unspecified: Secondary | ICD-10-CM | POA: Diagnosis present

## 2022-09-19 DIAGNOSIS — F1290 Cannabis use, unspecified, uncomplicated: Secondary | ICD-10-CM | POA: Diagnosis present

## 2022-09-19 DIAGNOSIS — G4710 Hypersomnia, unspecified: Secondary | ICD-10-CM | POA: Diagnosis present

## 2022-09-19 DIAGNOSIS — F411 Generalized anxiety disorder: Secondary | ICD-10-CM

## 2022-09-19 DIAGNOSIS — Z79899 Other long term (current) drug therapy: Secondary | ICD-10-CM

## 2022-09-19 MED ORDER — SERTRALINE HCL 25 MG PO TABS: 25.0000 mg | ORAL_TABLET | Freq: Every day | ORAL | 1 refills | Status: DC

## 2022-09-19 NOTE — Patient Instructions (Signed)
[email or web address]  www.[email or web address]  [employer] [email or web address] [address] [place], [place] [address]   [phone]  [facility], [facility] [email or web address] [address] [place], [place] [address]  [phone]   [facility] - [phone]  [facility] - [phone]  [facility] - [phone] counseling [phone]  [facility] [phone]   [email or web address] this clinician can offer telehealth and has a sliding scale option  [email or web address] this group also offers sliding scale rates and is based out of [place]  [employer] [facility] has interns who offer sliding scale rates and some of the full time clinicians do, as well. You complete their contact form on their website and the referrals coordinator will help to get connected to someone    Sertraline Tablets What is this medication? SERTRALINE (SER tra leen) treats depression, anxiety, obsessive-compulsive disorder (OCD), post-traumatic stress disorder (PTSD), and premenstrual dysphoric disorder (PMDD). It increases the amount of serotonin in the brain, a hormone that helps regulate mood. It belongs to a group of medications called SSRIs. This medicine may be used for other purposes; ask your health care provider or pharmacist if you have questions. COMMON BRAND NAME(S): Zoloft What should I tell my care team before I take this medication? They need to know if you have any of these conditions: Bleeding disorders Bipolar disorder or a family history of bipolar disorder Frequently drink alcohol Glaucoma Heart disease High blood pressure History of irregular heartbeat History of low levels of calcium, magnesium, or potassium in the blood Liver disease Receiving electroconvulsive  therapy Seizures Suicidal thoughts, plans, or attempt by you or a family member Take medications that prevent or treat blood clots Thyroid disease An unusual or allergic reaction to sertraline, other medications, foods, dyes, or preservatives Pregnant or trying to get pregnant Breastfeeding How should I use this medication? Take this medication by mouth with a glass of water. Take it as directed on the prescription label at the same time every day. You can take it with or without food. If it upsets your stomach, take it with food. Do not take your medication more often than directed. Keep taking this medication unless your care team tells you to stop. Stopping it too quickly can cause serious side effects. It can also make your condition worse. A special MedGuide will be given to you by the pharmacist with each prescription and refill. Be sure to read this information carefully each time. Talk to your care team about the use of this medication in children. While it may be prescribed for children as young as 7 years for selected conditions, precautions do apply. Overdosage: If you think you have taken too much of this medicine contact a poison control center or emergency room at once. NOTE: This medicine is only for you. Do not share this medicine with others. What if I miss a dose? If you miss a dose, take it as soon as you can. If it is almost time for your next dose, take only that dose. Do not take double or extra doses. What may interact with this medication? Do not take this medication with any of the following: Cisapride Dronedarone Linezolid MAOIs, such as Carbex, Eldepryl, Marplan, Nardil, and Parnate Methylene blue (injected into a vein) Pimozide Thioridazine This medication may also interact with the following: Alcohol Amphetamines Aspirin and aspirin-like medications Certain medications for fungal infections, such as ketoconazole, fluconazole, posaconazole,  itraconazole Certain medications for irregular heart beat, such as flecainide, quinidine, propafenone Certain medications for mental health conditions Certain medications for migraine headaches, such as almotriptan, eletriptan, frovatriptan, naratriptan, rizatriptan, sumatriptan, zolmitriptan Certain medications for seizures, such as carbamazepine, valproic acid, phenytoin Certain medications for sleep Certain medications that prevent or treat blood clots, such as warfarin, enoxaparin, dalteparin Cimetidine Digoxin Diuretics Fentanyl Isoniazid Lithium NSAIDs, medications for pain and inflammation, such as ibuprofen or naproxen Other medications that cause heart rhythm changes, such as dofetilide Rasagiline Safinamide Supplements, such as St. John's wort, kava kava, valerian Tolbutamide Tramadol Tryptophan This list may not describe all possible interactions. Give your health care provider a list of all the medicines, herbs, non-prescription drugs, or dietary supplements you use. Also tell them if you smoke, drink alcohol, or use illegal drugs. Some items may interact with your medicine. What should I watch for while using this medication? Tell your care team if your symptoms do not get better or if they get worse. Visit your care team for regular checks on your progress. Because it may take several weeks to see the full effects of this medication, it is important to continue your treatment as prescribed by your care team. Patients and their families should watch out for new or worsening thoughts of suicide or depression. Also watch out for sudden changes in feelings such as feeling anxious, agitated, panicky, irritable, hostile, aggressive, impulsive, severely restless, overly excited and hyperactive, or not being able to sleep. If this happens, especially at the beginning of treatment or after a change in dose, call your care team. This medication may affect your coordination, reaction  time, or judgment. Do not drive or operate machinery until you know how this medication affects you. Sit or stand up slowly to reduce the risk of dizzy or fainting spells. Drinking alcohol with this medication can increase the risk of these side effects. Your mouth may get dry. Chewing sugarless gum or sucking hard candy, and drinking plenty of water may help. Contact your care team if the problem does not go away or is severe. What side effects may I notice from receiving this medication? Side effects that you should report to your care team as soon as possible: Allergic reactions--skin rash, itching, hives, swelling of the face, lips, tongue, or throat Bleeding--bloody or black, tar-like stools, red or dark brown urine, vomiting blood or brown material that looks like coffee grounds, small red or purple spots on skin, unusual bleeding or bruising Heart rhythm changes--fast or irregular heartbeat, dizziness, feeling faint or lightheaded, chest pain, trouble breathing Low sodium level--muscle weakness, fatigue, dizziness, headache, confusion Serotonin syndrome--irritability, confusion, fast or irregular heartbeat, muscle stiffness, twitching muscles, sweating, high fever, seizure, chills, vomiting, diarrhea Sudden eye pain or change in vision such as blurred vision, seeing halos around lights, vision loss Thoughts of suicide or self-harm, worsening mood Side effects that usually do not require medical attention (report these to your care team if they continue or are bothersome): Change in sex drive or performance Diarrhea Excessive sweating Nausea Tremors or shaking Upset stomach This list may not describe all possible side effects. Call your doctor for medical advice about side effects. You may report side effects to FDA at 1-800-FDA-1088. Where should I keep my medication? Keep out of the reach of children and pets. Store at room temperature between 20 and 25 degrees C (68 and 77 degrees F).  Get rid of any unused medication after the expiration date. To get rid of medications  that are no longer needed or expired: Take the medication to a medication take-back program. Check with your pharmacy or law enforcement to find a location. If you cannot return the medication, check the label or package insert to see if the medication should be thrown out in the garbage or flushed down the toilet. If you are not sure, ask your care team. If it is safe to put in the trash, empty the medication out of the container. Mix the medication with cat litter, dirt, coffee grounds, or other unwanted substance. Seal the mixture in a bag or container. Put it in the trash. NOTE: This sheet is a summary. It may not cover all possible information. If you have questions about this medicine, talk to your doctor, pharmacist, or health care provider.  2023 Elsevier/Gold Standard (2020-05-26 00:00:00)
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
## 2022-09-20 ENCOUNTER — Encounter: Payer: Self-pay | Admitting: Nurse Practitioner

## 2022-09-20 ENCOUNTER — Ambulatory Visit (INDEPENDENT_AMBULATORY_CARE_PROVIDER_SITE_OTHER): Payer: Medicaid Other | Admitting: Nurse Practitioner

## 2022-09-20 VITALS — BP 120/72 | HR 81 | Temp 97.3°F | Ht 67.0 in | Wt 252.2 lb

## 2022-09-20 DIAGNOSIS — R0683 Snoring: Secondary | ICD-10-CM | POA: Diagnosis not present

## 2022-09-20 DIAGNOSIS — G4719 Other hypersomnia: Secondary | ICD-10-CM | POA: Diagnosis not present

## 2022-09-20 DIAGNOSIS — R600 Localized edema: Secondary | ICD-10-CM | POA: Insufficient documentation

## 2022-09-20 NOTE — Assessment & Plan Note (Signed)
She has snoring, excessive daytime sleepiness, nocturnal apneic events, morning headaches. BMI 39. Epworth 19. Given this,  I am concerned she could have sleep disordered breathing with obstructive sleep apnea. She will need sleep study for further evaluation.    - discussed how weight can impact sleep and risk for sleep disordered breathing - discussed options to assist with weight loss: combination of diet modification, cardiovascular and strength training exercises   - had an extensive discussion regarding the adverse health consequences related to untreated sleep disordered breathing - specifically discussed the risks for hypertension, coronary artery disease, cardiac dysrhythmias, cerebrovascular disease, and diabetes - lifestyle modification discussed   - discussed how sleep disruption can increase risk of accidents, particularly when driving - safe driving practices were discussed  Patient Instructions  Given your symptoms, I am concerned that you may have sleep disordered breathing with sleep apnea. You will need a sleep study for further evaluation. Someone will contact you to schedule this.   We discussed how untreated sleep apnea puts an individual at risk for cardiac arrhthymias, pulm HTN, DM, stroke and increases their risk for daytime accidents. We also briefly reviewed treatment options including weight loss, side sleeping position, oral appliance, CPAP therapy or referral to ENT for possible surgical options  Use caution when driving and pull over if you become sleepy.  Follow up in 6 weeks with Brittney Levie Wages,NP to go over sleep study results, or sooner, if needed

## 2022-09-20 NOTE — Assessment & Plan Note (Signed)
See above

## 2022-09-20 NOTE — Progress Notes (Signed)
@Patient  ID: Brittney [name], female    DOB: 01/14/1991, 32 y.o.   MRN: [id number]  Chief Complaint  Patient presents with   Consult    Snoring. Excessive tiredness. Restless sleep. For a few years.    Referring provider: Jomarie Longs, MD  HPI: 32 year old female, active smoker referred for sleep consult.  Past medical history significant for anxiety, depression, obesity, PTSD.   TEST/EVENTS:   09/20/2022: Today - sleep consult Patient presents today for sleep consult, referred by Dr. Elna Breslow. She has had trouble with her sleep for a long time. She snores loudly and has been told she stops breathing when she sleeps at night.  Will wake up gasping for air at times.  She wakes feeling tired in the morning. She is constantly fatigued during the day.  If she sits still for very long, she will doze off.  As long as she is active, tends to feel little bit better and is able to stay alert.  She does have morning headaches and dry mouth.  She does have some occasional sleep talking but no sleepwalking.  No nightmares.  Denies any drowsy driving, sleep paralysis.  No history of narcolepsy or symptoms of cataplexy She goes to bed between 8 to 10 PM.  Falls asleep very quickly.  Wakes up once to use the bathroom.  Her daughter is with her today who says that she sleeps very soundly.  She wakes between 530 to 6 AM.  Does not operate any heavy machinery in her job Animal nutritionist.  Her weight is up 60 pounds over the last 2 years.  Has never had a previous sleep study. No history of cardiac disease, stroke or diabetes.  She smokes half pack of cigarettes a day.  Drinks alcohol maybe twice a month.  She does smoke marijuana daily.  Lives with her boyfriend and children.  Works as a Agricultural consultant.  Father has kidney disease.  Mother had thyroid problems. She is curious if her leg swelling to be related to untreated sleep apnea.  She has been dealing with this for a few years now.  She feels like it has gotten worse  since she has been sitting more during the day.  She had labs recently with her PCP which were unremarkable.  She does have some orthopnea when lying flat.  Denies any DOE, palpitations, chest pain, PND, syncope.  Swelling tends to get worse as the day goes on.  Has not tried any compression stockings.  Never been on any diuretic therapy.  Epworth 19  Allergies  Allergen Reactions   Amoxicillin-Pot Clavulanate Nausea And Vomiting    Immunization History  Administered Date(s) Administered   DTaP 01/21/1990, 08/04/1990, 12/29/1990, 09/23/1991, 01/01/1994   HIB (PRP-OMP) 08/04/1990, 12/29/1990, 09/23/1991   Hepatitis B 02/13/2001, 03/20/2001, 08/07/2001   IPV 01/21/1990, 08/04/1990, 09/23/1991, 01/01/1994   MMR 09/23/1991, 01/01/1994   Tdap 08/08/2010    Past Medical History:  Diagnosis Date   ADHD (attention deficit hyperactivity disorder)     Tobacco History: Social History   Tobacco Use  Smoking Status Every Day   Packs/day: 0.50   Years: 17.00   Additional pack years: 0.00   Total pack years: 8.50   Types: Cigarettes   Start date: 10/14/2013  Smokeless Tobacco Never  Tobacco Comments   0.5 PPD -09/20/2022 khj   Ready to quit: Not Answered Counseling given: Not Answered Tobacco comments: 0.5 PPD -09/20/2022 khj   Outpatient Medications Prior to Visit  Medication Sig Dispense Refill  etonogestrel (NEXPLANON) 68 MG IMPL implant 1 each once by Subdermal route.     sertraline (ZOLOFT) 25 MG tablet Take 1 tablet (25 mg total) by mouth daily with breakfast. 30 tablet 1   Vitamin D, Ergocalciferol, (DRISDOL) 1.25 MG (50000 UNIT) CAPS capsule Take 1 capsule (50,000 Units total) by mouth every 7 (seven) days. 26 capsule 0   No facility-administered medications prior to visit.     Review of Systems:   Constitutional: No night sweats, fevers, chills, or lassitude. +excessive daytime fatigue, weight gain HEENT: No difficulty swallowing, tooth/dental problems, or sore throat.  No sneezing, itching, ear ache, nasal congestion, or post nasal drip. +am headaches, dry mouth CV:  +swelling in lower extremities, orthopnea. No chest pain, PND, anasarca, dizziness, palpitations, syncope Resp: +snoring, witnessed apneas. No shortness of breath with exertion or at rest. No excess mucus or change in color of mucus. No productive or non-productive. No hemoptysis. No wheezing.  No chest wall deformity GI:  No heartburn, indigestion, abdominal pain, nausea, vomiting, diarrhea, change in bowel habits, loss of appetite, bloody stools.  GU: No dysuria, change in color of urine, urgency or frequency.  No flank pain, no hematuria  Skin: No rash, lesions, ulcerations MSK:  No joint pain or swelling.   Neuro: No dizziness or lightheadedness.  Psych: No depression or anxiety. Mood stable. +sleep disturbance    Physical Exam:  BP 120/72 (BP Location: Left Arm, Cuff Size: Large)   Pulse 81   Temp (!) 97.3 F (36.3 C)   Ht 5\' 7"  (1.702 m)   Wt 252 lb 3.2 oz (114.4 kg)   SpO2 99%   BMI 39.50 kg/m   GEN: Pleasant, interactive, well-appearing; obese; in no acute distress. HEENT:  Normocephalic and atraumatic. PERRLA. Sclera white. Nasal turbinates pink, moist and patent bilaterally. No rhinorrhea present. Oropharynx pink and moist, without exudate or edema. No lesions, ulcerations, or postnasal drip. Mallampati III/IV NECK:  Supple w/ fair ROM. No JVD present. Normal carotid impulses w/o bruits. Thyroid symmetrical with no goiter or nodules palpated. No lymphadenopathy.   CV: RRR, no m/r/g, non-pitting dependent ankle edema. Pulses intact, +2 bilaterally. No cyanosis, pallor or clubbing. PULMONARY:  Unlabored, regular breathing. Clear bilaterally A&P w/o wheezes/rales/rhonchi. No accessory muscle use.  GI: BS present and normoactive. Soft, non-tender to palpation. No organomegaly or masses detected. MSK: No erythema, warmth or tenderness. Cap refil <2 sec all extrem. No deformities or  joint swelling noted.  Neuro: A/Ox3. No focal deficits noted.   Skin: Warm, no lesions or rashe Psych: Normal affect and behavior. Judgement and thought content appropriate.     Lab Results:  CBC    Component Value Date/Time   WBC 10.5 06/26/2022 1424   WBC 13.2 (H) 09/03/2014 1436   RBC 4.69 06/26/2022 1424   RBC 3.68 (L) 09/03/2014 1436   HGB 12.9 06/26/2022 1424   HCT 39.8 06/26/2022 1424   PLT 270 06/26/2022 1424   MCV 85 06/26/2022 1424   MCV 81 09/03/2014 1436   MCH 27.5 06/26/2022 1424   MCH 25.6 (L) 09/03/2014 1436   MCHC 32.4 06/26/2022 1424   MCHC 31.7 (L) 09/03/2014 1436   RDW 12.2 06/26/2022 1424   RDW 18.3 (H) 09/03/2014 1436   LYMPHSABS 3.8 (H) 06/26/2022 1424   LYMPHSABS 1.6 09/03/2014 1436   MONOABS 0.9 09/03/2014 1436   EOSABS 0.2 06/26/2022 1424   EOSABS 0.6 09/03/2014 1436   BASOSABS 0.1 06/26/2022 1424   BASOSABS 0.0 09/03/2014 1436  BMET    Component Value Date/Time   NA 138 06/26/2022 1424   NA 138 09/03/2014 1436   K 4.4 06/26/2022 1424   K 3.6 09/03/2014 1436   CL 101 06/26/2022 1424   CL 106 09/03/2014 1436   CO2 23 06/26/2022 1424   CO2 24 09/03/2014 1436   GLUCOSE 93 06/26/2022 1424   GLUCOSE 94 09/03/2014 1436   BUN 14 06/26/2022 1424   BUN 7 09/03/2014 1436   CREATININE 0.73 06/26/2022 1424   CREATININE 0.53 09/03/2014 1436   CALCIUM 9.4 06/26/2022 1424   CALCIUM 8.7 (L) 09/03/2014 1436   GFRNONAA >60 09/03/2014 1436   GFRAA >60 09/03/2014 1436    BNP No results found for: "BNP"   Imaging:  No results found.        No data to display          No results found for: "NITRICOXIDE"      Assessment & Plan:   Excessive daytime sleepiness She has snoring, excessive daytime sleepiness, nocturnal apneic events, morning headaches. BMI 39. Epworth 19. Given this,  I am concerned she could have sleep disordered breathing with obstructive sleep apnea. She will need sleep study for further evaluation.    -  discussed how weight can impact sleep and risk for sleep disordered breathing - discussed options to assist with weight loss: combination of diet modification, cardiovascular and strength training exercises   - had an extensive discussion regarding the adverse health consequences related to untreated sleep disordered breathing - specifically discussed the risks for hypertension, coronary artery disease, cardiac dysrhythmias, cerebrovascular disease, and diabetes - lifestyle modification discussed   - discussed how sleep disruption can increase risk of accidents, particularly when driving - safe driving practices were discussed  Patient Instructions  Given your symptoms, I am concerned that you may have sleep disordered breathing with sleep apnea. You will need a sleep study for further evaluation. Someone will contact you to schedule this.   We discussed how untreated sleep apnea puts an individual at risk for cardiac arrhthymias, pulm HTN, DM, stroke and increases their risk for daytime accidents. We also briefly reviewed treatment options including weight loss, side sleeping position, oral appliance, CPAP therapy or referral to ENT for possible surgical options  Use caution when driving and pull over if you become sleepy.  Follow up in 6 weeks with Florentina Addison Yanni Quiroa,NP to go over sleep study results, or sooner, if needed     Snoring See above  Bilateral lower extremity edema Likely due to venous insufficiency and obesity. No cardiac or kidney disease. Previous labs unremarkable. Advised she start using compression stockings, monitor sodium intake, and increase exercise. If no improvement and if she has underlying OSA, could consider echo to rule out underlying cardiac component/PH but otherwise, she does not have additional symptoms to point to this. Encouraged her to follow up with PCP as scheduled.    I spent 45 minutes of dedicated to the care of this patient on the date of this encounter  to include pre-visit review of records, face-to-face time with the patient discussing conditions above, post visit ordering of testing, clinical documentation with the electronic health record, making appropriate referrals as documented, and communicating necessary findings to members of the patients care team.  Noemi Chapel, NP 09/20/2022  Pt aware and understands NP's role.

## 2022-09-20 NOTE — Patient Instructions (Signed)
Given your symptoms, I am concerned that you may have sleep disordered breathing with sleep apnea. You will need a sleep study for further evaluation. Someone will contact you to schedule this.   We discussed how untreated sleep apnea puts an individual at risk for cardiac arrhthymias, pulm HTN, DM, stroke and increases their risk for daytime accidents. We also briefly reviewed treatment options including weight loss, side sleeping position, oral appliance, CPAP therapy or referral to ENT for possible surgical options  Use caution when driving and pull over if you become sleepy.  Follow up in 6 weeks with Katie Topher Buen[place]ent[place],NP to go over sleep study results, or sooner, if needed   

## 2022-09-20 NOTE — Progress Notes (Signed)
Reviewed and agree with assessment/plan.   Coralyn Helling, MD [facility] Pulmonary/Critical Care 09/20/2022, 6:45 PM Pager:  [phone]

## 2022-09-20 NOTE — Assessment & Plan Note (Signed)
Likely due to venous insufficiency and obesity. No cardiac or kidney disease. Previous labs unremarkable. Advised she start using compression stockings, monitor sodium intake, and increase exercise. If no improvement and if she has underlying OSA, could consider echo to rule out underlying cardiac component/PH but otherwise, she does not have additional symptoms to point to this. Encouraged her to follow up with PCP as scheduled.

## 2022-09-25 ENCOUNTER — Ambulatory Visit: Payer: Medicaid Other | Admitting: Family Medicine

## 2022-09-25 ENCOUNTER — Encounter: Payer: Self-pay | Admitting: Family Medicine

## 2022-09-25 VITALS — BP 114/78 | HR 75 | Temp 98.1°F | Ht 67.0 in | Wt 247.0 lb

## 2022-09-25 DIAGNOSIS — G43519 Persistent migraine aura without cerebral infarction, intractable, without status migrainosus: Secondary | ICD-10-CM | POA: Diagnosis not present

## 2022-09-25 DIAGNOSIS — E559 Vitamin D deficiency, unspecified: Secondary | ICD-10-CM

## 2022-09-25 DIAGNOSIS — R7303 Prediabetes: Secondary | ICD-10-CM

## 2022-09-25 DIAGNOSIS — G44011 Episodic cluster headache, intractable: Secondary | ICD-10-CM | POA: Diagnosis not present

## 2022-09-25 LAB — URINE DRUGS OF ABUSE SCREEN W ALC, ROUTINE (REF LAB)
Amphetamines, Urine: NEGATIVE ng/mL
Barbiturate, Ur: NEGATIVE ng/mL
Benzodiazepine Quant, Ur: NEGATIVE ng/mL
Cocaine (Metab.): NEGATIVE ng/mL
Ethanol U, Quan: NEGATIVE %
Methadone Screen, Urine: NEGATIVE ng/mL
Opiate Quant, Ur: NEGATIVE ng/mL
Phencyclidine, Ur: NEGATIVE ng/mL
Propoxyphene, Urine: NEGATIVE ng/mL

## 2022-09-25 LAB — PANEL 799049
CARBOXY THC GC/MS CONF: 249 ng/mL
Cannabinoid GC/MS, Ur: POSITIVE — AB

## 2022-09-25 MED ORDER — BUTALBITAL-APAP-CAFFEINE 50-325-40 MG PO TABS
1.0000 | ORAL_TABLET | Freq: Four times a day (QID) | ORAL | 0 refills | Status: DC | PRN
Start: 2022-09-25 — End: 2023-06-27

## 2022-09-25 NOTE — Assessment & Plan Note (Signed)
Trial of nurtec with rx Fioricet to assist Continue to monitor stressors/triggers to assist with prevention of further issues 

## 2022-09-25 NOTE — Progress Notes (Signed)
Established patient visit  Patient: Brittney [name]   DOB: Jul 26, 1990   32 y.o. Female  MRN: [id number] Visit Date: 09/25/2022  Today's healthcare provider: Jacky Kindle, FNP  Re Introduced to nurse practitioner role and practice setting.  All questions answered.  Discussed provider/patient relationship and expectations.  Chief Complaint  Patient presents with   Follow-up    mood follow up, with a1c and vit d blood draws. Pt stated--have not start taking the Zoloft .   Subjective    HPI HPI     Follow-up    Additional comments: mood follow up, with a1c and vit d blood draws. Pt stated--have not start taking the Zoloft .      Last edited by Shelly Bombard, CMA on 09/25/2022  2:58 PM.      Medications: Outpatient Medications Prior to Visit  Medication Sig   etonogestrel (NEXPLANON) 68 MG IMPL implant 1 each once by Subdermal route.   Vitamin D, Ergocalciferol, (DRISDOL) 1.25 MG (50000 UNIT) CAPS capsule Take 1 capsule (50,000 Units total) by mouth every 7 (seven) days.   sertraline (ZOLOFT) 25 MG tablet Take 1 tablet (25 mg total) by mouth daily with breakfast. (Patient not taking: Reported on 09/25/2022)   No facility-administered medications prior to visit.    Review of Systems    Objective    BP 114/78 (BP Location: Right Arm, Patient Position: Sitting, Cuff Size: Large)   Pulse 75   Temp 98.1 F (36.7 C)   Ht 5\' 7"  (1.702 m)   Wt 247 lb (112 kg)   SpO2 96%   BMI 38.69 kg/m   Physical Exam Vitals and nursing note reviewed.  Constitutional:      General: She is not in acute distress.    Appearance: Normal appearance. She is obese. She is not ill-appearing, toxic-appearing or diaphoretic.  HENT:     Head: Normocephalic and atraumatic.  Cardiovascular:     Rate and Rhythm: Normal rate and regular rhythm.     Pulses: Normal pulses.     Heart sounds: Normal heart sounds. No murmur heard.    No friction rub. No gallop.  Pulmonary:     Effort: Pulmonary effort is  normal. No respiratory distress.     Breath sounds: Normal breath sounds. No stridor. No wheezing, rhonchi or rales.  Chest:     Chest wall: No tenderness.  Musculoskeletal:        General: No swelling, tenderness, deformity or signs of injury. Normal range of motion.     Right lower leg: No edema.     Left lower leg: No edema.  Skin:    General: Skin is warm and dry.     Capillary Refill: Capillary refill takes less than 2 seconds.     Coloration: Skin is not jaundiced or pale.     Findings: No bruising, erythema, lesion or rash.  Neurological:     General: No focal deficit present.     Mental Status: She is alert and oriented to person, place, and time. Mental status is at baseline.     Cranial Nerves: No cranial nerve deficit.     Sensory: No sensory deficit.     Motor: No weakness.     Coordination: Coordination normal.  Psychiatric:        Mood and Affect: Mood is depressed. Affect is flat.        Behavior: Behavior normal.        Thought Content: Thought content normal.  Thought content does not include homicidal or suicidal ideation. Thought content does not include homicidal or suicidal plan.        Judgment: Judgment normal.    No results found for any visits on 09/25/22.  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 - Primary    Trial of nurtec with rx Fioricet to assist Continue to monitor stressors/triggers to assist with prevention of further issues      Relevant Medications   butalbital-acetaminophen-caffeine (FIORICET) 50-325-40 MG tablet     Nervous and Auditory   Intractable episodic cluster headache    Trial of nurtec with rx Fioricet to assist Continue to monitor stressors/triggers to assist with prevention of further issues      Relevant Medications   butalbital-acetaminophen-caffeine (FIORICET)  50-325-40 MG tablet     Other   Avitaminosis D    Chronic, on supplement Repeat labs       Relevant Orders   Vitamin D (25 hydroxy)   Prediabetes    Chronic, stable Repeat A1c Continue to recommend balanced, lower carb meals. Smaller meal size, adding snacks. Choosing water as drink of choice and increasing purposeful exercise. Not on medication at this time; pt has stopped wellbutrin per recs of Eappen MD       Relevant Orders   Hemoglobin A1c   Return if symptoms worsen or fail to improve.     Leilani Merl, FNP, have reviewed all documentation for this visit. The documentation on 09/25/22 for the exam, diagnosis, procedures, and orders are all accurate and complete.  Jacky Kindle, FNP  [facility] [phone] (phone) [phone] (fax)  [facility]

## 2022-09-25 NOTE — Assessment & Plan Note (Signed)
Trial of nurtec with rx Fioricet to assist Continue to monitor stressors/triggers to assist with prevention of further issues

## 2022-09-25 NOTE — Assessment & Plan Note (Signed)
Chronic, stable Repeat A1c Continue to recommend balanced, lower carb meals. Smaller meal size, adding snacks. Choosing water as drink of choice and increasing purposeful exercise. Not on medication at this time; pt has stopped wellbutrin per recs of Eappen MD

## 2022-09-25 NOTE — Assessment & Plan Note (Signed)
Chronic, on supplement Repeat labs

## 2022-09-26 LAB — HEMOGLOBIN A1C
Est. average glucose Bld gHb Est-mCnc: 114 mg/dL
Hgb A1c MFr Bld: 5.6 % (ref 4.8–5.6)

## 2022-09-26 LAB — VITAMIN D 25 HYDROXY (VIT D DEFICIENCY, FRACTURES): Vit D, 25-Hydroxy: 38 ng/mL (ref 30.0–100.0)

## 2022-09-26 NOTE — Progress Notes (Signed)
Vit D has tripled; very reassuring. A1c has also improved. Continue to recommend balanced, lower carb meals. Smaller meal size, adding snacks. Choosing water as drink of choice and increasing purposeful exercise. Continue Vit D supplement.

## 2022-10-02 ENCOUNTER — Ambulatory Visit: Payer: Medicaid Other | Admitting: Nurse Practitioner

## 2022-10-02 DIAGNOSIS — G4719 Other hypersomnia: Secondary | ICD-10-CM

## 2022-10-02 DIAGNOSIS — G4733 Obstructive sleep apnea (adult) (pediatric): Secondary | ICD-10-CM

## 2022-10-02 DIAGNOSIS — R0683 Snoring: Secondary | ICD-10-CM

## 2022-10-08 DIAGNOSIS — G4733 Obstructive sleep apnea (adult) (pediatric): Secondary | ICD-10-CM | POA: Diagnosis not present

## 2022-10-09 NOTE — Progress Notes (Signed)
Please notify patient she has mild sleep apnea. We can discuss results/treatment options at her follow up 6/28 or she can move up her appt to an earlier available time. Thanks!

## 2022-10-29 ENCOUNTER — Encounter: Payer: Self-pay | Admitting: Nurse Practitioner

## 2022-10-29 ENCOUNTER — Ambulatory Visit (INDEPENDENT_AMBULATORY_CARE_PROVIDER_SITE_OTHER): Payer: Medicaid Other | Admitting: Nurse Practitioner

## 2022-10-29 VITALS — BP 120/72 | HR 64 | Temp 98.3°F | Ht 67.0 in | Wt 251.6 lb

## 2022-10-29 DIAGNOSIS — R600 Localized edema: Secondary | ICD-10-CM | POA: Diagnosis not present

## 2022-10-29 DIAGNOSIS — Z6839 Body mass index (BMI) 39.0-39.9, adult: Secondary | ICD-10-CM

## 2022-10-29 DIAGNOSIS — G4733 Obstructive sleep apnea (adult) (pediatric): Secondary | ICD-10-CM | POA: Diagnosis not present

## 2022-10-29 NOTE — Assessment & Plan Note (Signed)
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 Reviewed risks of untreated mild OSA and potential treatment options. She has significant daytime burden - shared decision to move forward with CPAP. Orders placed for auto CPAP 5-15 cmH2O, mask of choice and heated humidity. Educated on proper use/care of device. Risks/benefits reviewed. Aware of safe driving practices.  Patient Instructions  Start CPAP 5-15 cmH2O, mask of choice, heated humidity, every night, minimum of 4-6 hours a night.  Change equipment every 30 days or as directed by DME. Wash your tubing with warm soap and water daily, hang to dry. Wash humidifier portion weekly. Use bottled, distilled water - change Be aware of reduced alertness and do not drive or operate heavy machinery if experiencing this or drowsiness.  Notify if persistent daytime sleepiness occurs even with consistent use of CPAP.  We discussed how untreated sleep apnea puts an individual at risk for cardiac arrhthymias, pulm HTN, DM, stroke and increases their risk for daytime accidents. We also briefly reviewed treatment options including weight loss, side sleeping position, oral appliance, CPAP therapy or referral to ENT for possible surgical options  Follow up in 12 weeks in with Dr. Craige Cotta, Dr. Belia Heman, Katie Gaila Engebretsen,Np, or sooner if needed

## 2022-10-29 NOTE — Patient Instructions (Signed)
Start CPAP 5-15 cmH2O, mask of choice, heated humidity, every night, minimum of 4-6 hours a night.  Change equipment every 30 days or as directed by DME. Wash your tubing with warm soap and water daily, hang to dry. Wash humidifier portion weekly. Use bottled, distilled water - change Be aware of reduced alertness and do not drive or operate heavy machinery if experiencing this or drowsiness.  Notify if persistent daytime sleepiness occurs even with consistent use of CPAP.  We discussed how untreated sleep apnea puts an individual at risk for cardiac arrhthymias, pulm HTN, DM, stroke and increases their risk for daytime accidents. We also briefly reviewed treatment options including weight loss, side sleeping position, oral appliance, CPAP therapy or referral to ENT for possible surgical options  Follow up in 12 weeks in with Dr. Craige Cotta, Dr. Belia Heman, Katie Lucius Wise,Np, or sooner if needed

## 2022-10-29 NOTE — Progress Notes (Signed)
Reviewed and agree with assessment/plan.   Coralyn Helling, MD [facility] Pulmonary/Critical Care 10/29/2022, 12:24 PM Pager:  [phone]

## 2022-10-29 NOTE — Assessment & Plan Note (Signed)
She will continue to use compression stockings. Previously labs were unremarkable. Advised her to follow up with her PCP for persistent symptoms.

## 2022-10-29 NOTE — Progress Notes (Signed)
@Patient  ID: Brittney [name], female    DOB: [birth year], 32 y.o.   MRN: [id number]  Chief Complaint  Patient presents with   Follow-up    HST.     Referring provider: Jacky Kindle, FNP  HPI: 32 year old female, active smoker referred for sleep consult 09/20/2022.  Past medical history significant for anxiety, depression, obesity, PTSD.   TEST/EVENTS:  10/02/2022 HST: AHI 11.9/h, SpO2 low 81%  09/20/2022: OV with Sofija Antwi NP for sleep consult, referred by Dr. Elna Breslow. She has had trouble with her sleep for a long time. She snores loudly and has been told she stops breathing when she sleeps at night.  Will wake up gasping for air at times.  She wakes feeling tired in the morning. She is constantly fatigued during the day.  If she sits still for very long, she will doze off.  As long as she is active, tends to feel little bit better and is able to stay alert.  She does have morning headaches and dry mouth.  She does have some occasional sleep talking but no sleepwalking.  No nightmares.  Denies any drowsy driving, sleep paralysis.  No history of narcolepsy or symptoms of cataplexy She goes to bed between 8 to 10 PM.  Falls asleep very quickly.  Wakes up once to use the bathroom.  Her daughter is with her today who says that she sleeps very soundly.  She wakes between 530 to 6 AM.  Does not operate any heavy machinery in her job Animal nutritionist.  Her weight is up 60 pounds over the last 2 years.  Has never had a previous sleep study. No history of cardiac disease, stroke or diabetes.  She smokes half pack of cigarettes a day.  Drinks alcohol maybe twice a month.  She does smoke marijuana daily.  Lives with her boyfriend and children.  Works as a Agricultural consultant.  Father has kidney disease.  Mother had thyroid problems. She is curious if her leg swelling to be related to untreated sleep apnea.  She has been dealing with this for a few years now.  She feels like it has gotten worse since she has been sitting more  during the day.  She had labs recently with her PCP which were unremarkable.  She does have some orthopnea when lying flat.  Denies any DOE, palpitations, chest pain, PND, syncope.  Swelling tends to get worse as the day goes on.  Has not tried any compression stockings.  Never been on any diuretic therapy. Epworth 19  10/29/2022: Today - follow up Patient presents today for follow up to discuss home sleep study results, which revealed mild OSA. She continues to have trouble with daytime fatigue and poor sleep quality. She would like to discuss her treatment options. She denies any drowsy driving.  She has been trying compression stockings for her leg swelling, which helps some days but not others. She has not discussed this with her PCP yet.   Allergies  Allergen Reactions   Amoxicillin-Pot Clavulanate Nausea And Vomiting    Immunization History  Administered Date(s) Administered   DTaP 01/21/1990, 08/04/1990, 12/29/1990, 09/23/1991, 01/01/1994   HIB (PRP-OMP) 08/04/1990, 12/29/1990, 09/23/1991   Hepatitis B 02/13/2001, 03/20/2001, 08/07/2001   IPV 01/21/1990, 08/04/1990, 09/23/1991, 01/01/1994   MMR 09/23/1991, 01/01/1994   Tdap 08/08/2010, 01/24/2020    Past Medical History:  Diagnosis Date   ADHD (attention deficit hyperactivity disorder)     Tobacco History: Social History   Tobacco Use  Smoking  Status Every Day   Packs/day: 0.50   Years: 17.00   Additional pack years: 0.00   Total pack years: 8.50   Types: Cigarettes  Smokeless Tobacco Never  Tobacco Comments   0.5 PPD -09/20/2022 khj   Ready to quit: Not Answered Counseling given: Not Answered Tobacco comments: 0.5 PPD -09/20/2022 khj   Outpatient Medications Prior to Visit  Medication Sig Dispense Refill   butalbital-acetaminophen-caffeine (FIORICET) 50-325-40 MG tablet Take 1 tablet by mouth every 6 (six) hours as needed for headache. 14 tablet 0   etonogestrel (NEXPLANON) 68 MG IMPL implant 1 each once by  Subdermal route.     sertraline (ZOLOFT) 25 MG tablet Take 1 tablet (25 mg total) by mouth daily with breakfast. 30 tablet 1   Vitamin D, Ergocalciferol, (DRISDOL) 1.25 MG (50000 UNIT) CAPS capsule Take 1 capsule (50,000 Units total) by mouth every 7 (seven) days. 26 capsule 0   No facility-administered medications prior to visit.     Review of Systems:   Constitutional: No night sweats, fevers, chills, or lassitude. +excessive daytime fatigue, weight gain HEENT: No difficulty swallowing, tooth/dental problems, or sore throat. No sneezing, itching, ear ache, nasal congestion, or post nasal drip. +am headaches, dry mouth CV:  +swelling in lower extremities, orthopnea. No chest pain, PND, anasarca, dizziness, palpitations, syncope Resp: +snoring, witnessed apneas. No shortness of breath with exertion or at rest. No excess mucus or change in color of mucus. No productive or non-productive. No hemoptysis. No wheezing.  No chest wall deformity GI:  No heartburn, indigestion, abdominal pain, nausea, vomiting, diarrhea, change in bowel habits, loss of appetite, bloody stools.  GU: No dysuria, change in color of urine, urgency or frequency.  No flank pain, no hematuria  Skin: No rash, lesions, ulcerations MSK:  No joint pain or swelling.   Neuro: No dizziness or lightheadedness.  Psych: No depression or anxiety. Mood stable. +sleep disturbance    Physical Exam:  BP 120/72 (BP Location: Left Arm, Cuff Size: Normal)   Pulse 64   Temp 98.3 F (36.8 C) (Temporal)   Ht 5\' 7"  (1.702 m)   Wt 251 lb 9.6 oz (114.1 kg)   SpO2 99%   BMI 39.41 kg/m   GEN: Pleasant, interactive, well-appearing; obese; in no acute distress. HEENT:  Normocephalic and atraumatic. PERRLA. Sclera white. Nasal turbinates pink, moist and patent bilaterally. No rhinorrhea present. Oropharynx pink and moist, without exudate or edema. No lesions, ulcerations, or postnasal drip. Mallampati III/IV NECK:  Supple w/ fair ROM. No  JVD present. Normal carotid impulses w/o bruits. Thyroid symmetrical with no goiter or nodules palpated. No lymphadenopathy.   CV: RRR, no m/r/g, no BLE edema. Pulses intact, +2 bilaterally. No cyanosis, pallor or clubbing. PULMONARY:  Unlabored, regular breathing. Clear bilaterally A&P w/o wheezes/rales/rhonchi. No accessory muscle use.  GI: BS present and normoactive. Soft, non-tender to palpation. No organomegaly or masses detected. MSK: No erythema, warmth or tenderness. Cap refil <2 sec all extrem. No deformities or joint swelling noted.  Neuro: A/Ox3. No focal deficits noted.   Skin: Warm, no lesions or rashe Psych: Normal affect and behavior. Judgement and thought content appropriate.     Lab Results:  CBC    Component Value Date/Time   WBC 10.5 06/26/2022 1424   WBC 13.2 (H) 09/03/2014 1436   RBC 4.69 06/26/2022 1424   RBC 3.68 (L) 09/03/2014 1436   HGB 12.9 06/26/2022 1424   HCT 39.8 06/26/2022 1424   PLT 270 06/26/2022 1424  MCV 85 06/26/2022 1424   MCV 81 09/03/2014 1436   MCH 27.5 06/26/2022 1424   MCH 25.6 (L) 09/03/2014 1436   MCHC 32.4 06/26/2022 1424   MCHC 31.7 (L) 09/03/2014 1436   RDW 12.2 06/26/2022 1424   RDW 18.3 (H) 09/03/2014 1436   LYMPHSABS 3.8 (H) 06/26/2022 1424   LYMPHSABS 1.6 09/03/2014 1436   MONOABS 0.9 09/03/2014 1436   EOSABS 0.2 06/26/2022 1424   EOSABS 0.6 09/03/2014 1436   BASOSABS 0.1 06/26/2022 1424   BASOSABS 0.0 09/03/2014 1436    BMET    Component Value Date/Time   NA 138 06/26/2022 1424   NA 138 09/03/2014 1436   K 4.4 06/26/2022 1424   K 3.6 09/03/2014 1436   CL 101 06/26/2022 1424   CL 106 09/03/2014 1436   CO2 23 06/26/2022 1424   CO2 24 09/03/2014 1436   GLUCOSE 93 06/26/2022 1424   GLUCOSE 94 09/03/2014 1436   BUN 14 06/26/2022 1424   BUN 7 09/03/2014 1436   CREATININE 0.73 06/26/2022 1424   CREATININE 0.53 09/03/2014 1436   CALCIUM 9.4 06/26/2022 1424   CALCIUM 8.7 (L) 09/03/2014 1436   GFRNONAA >60  09/03/2014 1436   GFRAA >60 09/03/2014 1436    BNP No results found for: "BNP"   Imaging:  No results found.        No data to display          No results found for: "NITRICOXIDE"      Assessment & Plan:   Obstructive sleep apnea syndrome, mild Mild OSA with AHI 11.9/h. Reviewed risks of untreated mild OSA and potential treatment options. She has significant daytime burden - shared decision to move forward with CPAP. Orders placed for auto CPAP 5-15 cmH2O, mask of choice and heated humidity. Educated on proper use/care of device. Risks/benefits reviewed. Aware of safe driving practices.  Patient Instructions  Start CPAP 5-15 cmH2O, mask of choice, heated humidity, every night, minimum of 4-6 hours a night.  Change equipment every 30 days or as directed by DME. Wash your tubing with warm soap and water daily, hang to dry. Wash humidifier portion weekly. Use bottled, distilled water - change Be aware of reduced alertness and do not drive or operate heavy machinery if experiencing this or drowsiness.  Notify if persistent daytime sleepiness occurs even with consistent use of CPAP.  We discussed how untreated sleep apnea puts an individual at risk for cardiac arrhthymias, pulm HTN, DM, stroke and increases their risk for daytime accidents. We also briefly reviewed treatment options including weight loss, side sleeping position, oral appliance, CPAP therapy or referral to ENT for possible surgical options  Follow up in 12 weeks in with Dr. Craige Cotta, Dr. Belia Heman, Florentina Addison Drayden Lukas,Np, or sooner if needed    Bilateral lower extremity edema She will continue to use compression stockings. Previously labs were unremarkable. Advised her to follow up with her PCP for persistent symptoms.  Class 2 severe obesity due to excess calories with serious comorbidity and body mass index (BMI) of 39.0 to 39.9 in adult [facility]) Healthy weight loss encouraged    I spent 35 minutes of dedicated to the care  of this patient on the date of this encounter to include pre-visit review of records, face-to-face time with the patient discussing conditions above, post visit ordering of testing, clinical documentation with the electronic health record, making appropriate referrals as documented, and communicating necessary findings to members of the patients care team.  Noemi Chapel,  NP 10/29/2022  Pt aware and understands NP's role.

## 2022-10-29 NOTE — Assessment & Plan Note (Signed)
Healthy weight loss encouraged 

## 2022-11-04 ENCOUNTER — Ambulatory Visit (INDEPENDENT_AMBULATORY_CARE_PROVIDER_SITE_OTHER): Payer: Medicaid Other | Admitting: Dermatology

## 2022-11-04 DIAGNOSIS — L732 Hidradenitis suppurativa: Secondary | ICD-10-CM

## 2022-11-04 DIAGNOSIS — L810 Postinflammatory hyperpigmentation: Secondary | ICD-10-CM

## 2022-11-04 MED ORDER — MINOCYCLINE HCL 100 MG PO CAPS
100.0000 mg | ORAL_CAPSULE | Freq: Every day | ORAL | 1 refills | Status: AC
Start: 2022-11-04 — End: 2023-01-03

## 2022-11-04 MED ORDER — TRIAMCINOLONE ACETONIDE 10 MG/ML IJ SUSP
10.0000 mg | Freq: Once | INTRAMUSCULAR | Status: AC
Start: 2022-11-04 — End: 2022-11-04
  Administered 2022-11-04: 10 mg

## 2022-11-04 MED ORDER — FLUCONAZOLE 150 MG PO TABS: 150.0000 mg | ORAL_TABLET | Freq: Every day | ORAL | 2 refills | Status: DC

## 2022-11-04 MED ORDER — MUPIROCIN 2 % EX OINT
1.0000 | TOPICAL_OINTMENT | Freq: Two times a day (BID) | CUTANEOUS | 3 refills | Status: DC
Start: 2022-11-04 — End: 2024-03-30

## 2022-11-04 NOTE — Patient Instructions (Addendum)
Instructions for [facility] Medications  One or more of your medications was sent to the [facility] mail order compounding pharmacy. You will receive an email from them and can purchase the medicine through that link. It will then be mailed to your home at the address you confirmed. If for any reason you do not receive an email from them, please check your spam folder. If you still do not find the email, please let us know. [facility] phone number is [phone].            Due to recent changes in healthcare laws, you may see results of your pathology and/or laboratory studies on MyChart before the doctors have had a chance to review them. We understand that in some cases there may be results that are confusing or concerning to you. Please understand that not all results are received at the same time and often the doctors may need to interpret multiple results in order to provide you with the best plan of care or course of treatment. Therefore, we ask that you please give us 2 business days to thoroughly review all your results before contacting the office for clarification. Should we see a critical lab result, you will be contacted sooner.   If You Need Anything After Your Visit  If you have any questions or concerns for your doctor, please call our main line at [phone] If no one answers, please leave a voicemail as directed and we will return your call as soon as possible. Messages left after 4 pm will be answered the following business day.   You may also send us a message via MyChart. We typically respond to MyChart messages within 1-2 business days.  For prescription refills, please ask your pharmacy to contact our office. Our fax number is [phone].  If you have an urgent issue when the clinic is closed that cannot wait until the next business day, you can page your doctor at the number below.    Please note that while we do our best to be available for  urgent issues outside of office hours, we are not available 24/7.   If you have an urgent issue and are unable to reach us, you may choose to seek medical care at your doctor's office, retail clinic, urgent care center, or emergency room.  If you have a medical emergency, please immediately call 911 or go to the emergency department. In the event of inclement weather, please call our main line at [phone] for an update on the status of any delays or closures.  Dermatology Medication Tips: Please keep the boxes that topical medications come in in order to help keep track of the instructions about where and how to use these. Pharmacies typically print the medication instructions only on the boxes and not directly on the medication tubes.   If your medication is too expensive, please contact our office at [phone] or send us a message through MyChart.   We are unable to tell what your co-pay for medications will be in advance as this is different depending on your insurance coverage. However, we may be able to find a substitute medication at lower cost or fill out paperwork to get insurance to cover a needed medication.   If a prior authorization is required to get your medication covered by your insurance company, please allow us 1-2 business days to complete this process.  Drug prices often vary depending on where the prescription is filled and some pharmacies   may offer cheaper prices.  The website www.goodrx.com contains coupons for medications through different pharmacies. The prices here do not account for what the cost may be with help from insurance (it may be cheaper with your insurance), but the website can give you the price if you did not use any insurance.  - You can print the associated coupon and take it with your prescription to the pharmacy.  - You may also stop by our office during regular business hours and pick up a GoodRx coupon card.  - If you need your prescription  sent electronically to a different pharmacy, notify our office through [facility] MyChart or by phone at [phone]     

## 2022-11-04 NOTE — Progress Notes (Signed)
New Patient Visit   Subjective  Brittney [name] is a 32 y.o. female who presents for the following: HIDRADENITIS SUPPURATIVA  Patient states she has  located at the scattered that she would like to have examined. Patient reports the areas have been there for  1-2  year(s). she reports the areas are bothersome. Patient rates her discomfort levels 7 out of 10. She describes the areas as tender, red and forms a cyst She states that the areas have spread. Patient reports she has previously been treated for these areas.She tried Doxycycline for about 2 weeks then stopped d/t GI upset. Patient denies Hx of bx. Patient reports family history of skin cancer(s)(Grandmother).    The following portions of the chart were reviewed this encounter and updated as appropriate: medications, allergies, medical history  Review of Systems:  No other skin or systemic complaints except as noted in HPI or Assessment and Plan.  Objective  Well appearing patient in no apparent distress; mood and affect are within normal limits.  A full examination was performed including axillae, abdomen & buttocks.All findings within normal limits unless otherwise noted below.    Relevant exam findings are noted in the Assessment and Plan.  Left Medial Thigh, Right Medial Thigh  Hurley Stage 2: single or multiple, widely separated recurrent abscesses with sinus tract formation and scarring     Assessment & Plan   The patient presents with a history of recurrent cysts in the axillary, inguinal, and gluteal regions, diagnosed as Doreene Adas Stage 2 hidradenitis suppurativa. She reports significant discomfort and has previously experienced gastrointestinal upset and yeast infections from doxycycline. The treatment plan includes lifestyle modifications, topical treatments, minocycline for flares, in office intralesional Kenalog injections, and hydroquinone for post-inflammatory hyperpigmentation. Follow-up is scheduled in three months to  assess treatment response.  HIDRADENITIS SUPPURATIVA Exam:  Hurley Stage 2: single or multiple, widely separated recurrent abscesses with sinus tract formation and scarring  Subcutaneous nod with perifollicular comedone plugging  Inner Thigh Tender subcutaneous cystic nodules with hypopigmentation  Flared  Hidradenitis Suppurativa is a chronic; persistent; non-curable, but treatable condition due to abnormal inflamed sweat glands in the body folds (axilla, inframammary, groin, medial thighs), causing recurrent painful draining cysts and scarring. It can be associated with severe scarring acne and cysts; also abscesses and scarring of scalp. The goal is control and prevention of flares, as it is not curable. Scars are permanent and can be thickened. Treatment may include daily use of topical medication and oral antibiotics.  Oral isotretinoin may also be helpful.  For some cases, Humira or Cosentyx (biologic injections) may be prescribed to decrease the inflammatory process and prevent flares.  When indicated, inflamed cysts may also be treated surgically.  Treatment Plan: - Educated on importance of diet,Weight Controlled and  smoking Cessation - Recommend Panoxyl 4% body wash to control bacteria that is on the surface of the skin - Apply Mupirocin ointment to areas when having a flare - Minocycline once a day when you have a flare  Hidradenitis suppurativa Left Medial Thigh; Right Medial Thigh  Symptomatic, irritating, patient would like treated.  Benign-appearing.  Call clinic for new or changing lesions.   Prior to procedure, discussed risks of blister formation, small wound, skin dyspigmentation, or rare scar following treatment. Recommend Vaseline ointment to treated areas while healing.  Procedure Note Intralesional Injection  Location: bilateral inner thighs  Informed Consent: Discussed risks (infection, pain, bleeding, bruising, thinning of the skin, loss of skin pigment,  lack  of resolution, and recurrence of lesion) and benefits of the procedure, as well as the alternatives. Informed consent was obtained. Preparation: The area was prepared a standard fashion.  Anesthesia: none  Procedure Details: An intralesional injection was performed with Kenalog 10 mg/cc. .15 cc in total were injected.  Total number of injections: 4  Plan: The patient was instructed on post-op care. Recommend OTC analgesia as needed for pain.   Related Medications minocycline (MINOCIN) 100 MG capsule Take 1 capsule (100 mg total) by mouth daily. TAKE AS SOON AS YOU SEE A SIGN OF A FLARE WITH FOOD  triamcinolone acetonide (KENALOG) 10 MG/ML injection 10 mg   fluconazole (DIFLUCAN) 150 MG tablet Take 1 tablet (150 mg total) by mouth daily. If you develop yeast infection symptoms  mupirocin ointment (BACTROBAN) 2 % Apply 1 Application topically 2 (two) times daily.  .15ml injected of Kenalog 10 at site  POST-INFLAMMATORY HYPERPIGMENTATION (PIH) Exam: hyperpigmented macules and/or patches at face   This is a benign condition that comes from having previous inflammation in the skin and will fade with time over months to sometimes years. Recommend daily sun protection including sunscreen SPF 30+ to sun-exposed areas. - Recommend treating any itchy or red areas on the skin quickly to prevent new areas of PIH. Treating with prescription medicines such as hydroquinone may help fade dark spots faster.    Treatment Plan:  -We will send  Tranexamic Acid: 8%,Azelaic Acid: 10,Niacinamide: 2% Vehicle: Cream from [facility]       Return in about 3 months (around 02/04/2023) for HS F/U.    Documentation: I have reviewed the above documentation for accuracy and completeness, and I agree with the above.  Stasia Cavalier, am acting as scribe for Langston Reusing, DO.  Langston Reusing, DO

## 2022-11-08 ENCOUNTER — Ambulatory Visit: Payer: Medicaid Other | Admitting: Nurse Practitioner

## 2022-11-08 DIAGNOSIS — H6691 Otitis media, unspecified, right ear: Secondary | ICD-10-CM | POA: Diagnosis not present

## 2022-11-11 ENCOUNTER — Encounter: Payer: Self-pay | Admitting: Dermatology

## 2022-11-12 ENCOUNTER — Ambulatory Visit: Payer: Medicaid Other | Admitting: Psychiatry

## 2022-11-18 DIAGNOSIS — G4733 Obstructive sleep apnea (adult) (pediatric): Secondary | ICD-10-CM | POA: Diagnosis not present

## 2022-12-12 DIAGNOSIS — G4733 Obstructive sleep apnea (adult) (pediatric): Secondary | ICD-10-CM | POA: Diagnosis not present

## 2022-12-20 DIAGNOSIS — G4733 Obstructive sleep apnea (adult) (pediatric): Secondary | ICD-10-CM | POA: Diagnosis not present

## 2022-12-20 DIAGNOSIS — R400 Somnolence: Secondary | ICD-10-CM | POA: Diagnosis not present

## 2023-01-01 ENCOUNTER — Ambulatory Visit: Payer: Medicaid Other | Admitting: Psychiatry

## 2023-01-12 DIAGNOSIS — G4733 Obstructive sleep apnea (adult) (pediatric): Secondary | ICD-10-CM | POA: Diagnosis not present

## 2023-01-17 ENCOUNTER — Telehealth: Payer: Medicaid Other | Admitting: Nurse Practitioner

## 2023-02-04 ENCOUNTER — Encounter: Payer: Self-pay | Admitting: Family Medicine

## 2023-02-04 ENCOUNTER — Other Ambulatory Visit (HOSPITAL_COMMUNITY)
Admission: RE | Admit: 2023-02-04 | Discharge: 2023-02-04 | Disposition: A | Discharge status: Home / Self Care | Payer: Medicaid Other | Source: Ambulatory Visit | Attending: Family Medicine | Admitting: Family Medicine

## 2023-02-04 ENCOUNTER — Ambulatory Visit: Payer: Medicaid Other | Admitting: Family Medicine

## 2023-02-04 VITALS — BP 119/74 | HR 73 | Ht 67.0 in | Wt 246.3 lb

## 2023-02-04 DIAGNOSIS — L732 Hidradenitis suppurativa: Secondary | ICD-10-CM

## 2023-02-04 DIAGNOSIS — Z124 Encounter for screening for malignant neoplasm of cervix: Secondary | ICD-10-CM | POA: Insufficient documentation

## 2023-02-04 DIAGNOSIS — Z0000 Encounter for general adult medical examination without abnormal findings: Secondary | ICD-10-CM | POA: Diagnosis not present

## 2023-02-04 DIAGNOSIS — F332 Major depressive disorder, recurrent severe without psychotic features: Secondary | ICD-10-CM

## 2023-02-04 DIAGNOSIS — F39 Unspecified mood [affective] disorder: Secondary | ICD-10-CM | POA: Insufficient documentation

## 2023-02-04 DIAGNOSIS — F1220 Cannabis dependence, uncomplicated: Secondary | ICD-10-CM

## 2023-02-04 DIAGNOSIS — F17210 Nicotine dependence, cigarettes, uncomplicated: Secondary | ICD-10-CM

## 2023-02-04 MED ORDER — DOXYCYCLINE HYCLATE 100 MG PO TABS
100.0000 mg | ORAL_TABLET | Freq: Every day | ORAL | 3 refills | Status: DC
Start: 2023-02-04 — End: 2023-06-17

## 2023-02-04 NOTE — Assessment & Plan Note (Signed)
Chronic, worsening Referral to psych previously sent; pt non adherent to medications Defer changes at this time given upcoming appt

## 2023-02-04 NOTE — Assessment & Plan Note (Signed)
Chronic, stable Remains pre contemplative regarding cessation at this time

## 2023-02-04 NOTE — Assessment & Plan Note (Signed)
Continue to recommend breast self exams to assist

## 2023-02-04 NOTE — Progress Notes (Signed)
Established patient visit   Patient: Brittney [name]   DOB: [birth year]   32 y.o. Female  MRN: [id number] Visit Date: 02/04/2023  Today's healthcare provider: Jacky Kindle, FNP  Introduced to nurse practitioner role and practice setting.  All questions answered.  Discussed provider/patient relationship and expectations.  Subjective    HPI   Medications: Outpatient Medications Prior to Visit  Medication Sig   butalbital-acetaminophen-caffeine (FIORICET) 50-325-40 MG tablet Take 1 tablet by mouth every 6 (six) hours as needed for headache.   etonogestrel (NEXPLANON) 68 MG IMPL implant 1 each once by Subdermal route.   fluconazole (DIFLUCAN) 150 MG tablet Take 1 tablet (150 mg total) by mouth daily. If you develop yeast infection symptoms   mupirocin ointment (BACTROBAN) 2 % Apply 1 Application topically 2 (two) times daily.   sertraline (ZOLOFT) 25 MG tablet Take 1 tablet (25 mg total) by mouth daily with breakfast.   Vitamin D, Ergocalciferol, (DRISDOL) 1.25 MG (50000 UNIT) CAPS capsule Take 1 capsule (50,000 Units total) by mouth every 7 (seven) days.   No facility-administered medications prior to visit.     Objective    BP 119/74 (BP Location: Right Arm, Patient Position: Sitting, Cuff Size: Normal)   Pulse 73   Ht 5\' 7"  (1.702 m)   Wt 246 lb 4.8 oz (111.7 kg)   BMI 38.58 kg/m   Physical Exam Chest:     Comments: Breasts: breasts appear normal, no suspicious masses, no skin or nipple changes or axillary nodes, right breast normal without mass, skin or nipple changes or axillary nodes, left breast normal without mass, skin or nipple changes or axillary nodes, risk and benefit of breast self-exam was discussed  Genitourinary:    General: Normal vulva.     Tanner stage (genital): 5.     Vagina: Vaginal discharge present.     Cervix: Normal.     Uterus: Normal.      Adnexa: Right adnexa normal and left adnexa normal.     Comments: HS scarring around thighs and abdominal  folds    No results found for any visits on 02/04/23.  Assessment & Plan     Problem List Items Addressed This Visit       Musculoskeletal and Integument   Hidradenitis suppurativa    Chronic, stable Trial of low dose abx to assist F/u as needed Hx of derm appts       Relevant Medications   doxycycline (VIBRA-TABS) 100 MG tablet     Other   Marijuana dependence (HCC)    Chronic, stable Remains pre contemplative regarding cessation at this time       Mood disorder (HCC)    Chronic, exacerbated Has psych appt f/u Declines medication change at this time       Morbid obesity (HCC)    Chronic, stable Associated with chronic mood disorder, prediabetes, tobacco abuse, anxiety       Normal breast exam    Continue to recommend breast self exams to assist       Pap smear for cervical cancer screening - Primary    Request for PAP and STI testing      Relevant Orders   Cytology - PAP   Severe episode of recurrent major depressive disorder, without psychotic features (HCC)    Chronic, worsening Referral to psych previously sent; pt non adherent to medications Defer changes at this time given upcoming appt       Tobacco dependence due to cigarettes  Chronic, stable Remains pre contemplative regarding cessation at this time       Return in about 3 months (around 05/06/2023) for chonic disease management.     Leilani Merl, FNP, have reviewed all documentation for this visit. The documentation on 02/04/23 for the exam, diagnosis, procedures, and orders are all accurate and complete.  Jacky Kindle, FNP  [facility] [phone] (phone) [phone] (fax)  [facility]

## 2023-02-04 NOTE — Assessment & Plan Note (Signed)
Chronic, stable Associated with chronic mood disorder, prediabetes, tobacco abuse, anxiety

## 2023-02-04 NOTE — Assessment & Plan Note (Signed)
Chronic, exacerbated Has psych appt f/u Declines medication change at this time

## 2023-02-04 NOTE — Assessment & Plan Note (Signed)
Chronic, stable Trial of low dose abx to assist F/u as needed Hx of derm appts

## 2023-02-04 NOTE — Assessment & Plan Note (Signed)
Request for PAP and STI testing

## 2023-02-05 ENCOUNTER — Ambulatory Visit (INDEPENDENT_AMBULATORY_CARE_PROVIDER_SITE_OTHER): Payer: Medicaid Other | Admitting: Psychiatry

## 2023-02-05 ENCOUNTER — Encounter: Payer: Self-pay | Admitting: Psychiatry

## 2023-02-05 VITALS — BP 116/76 | HR 65 | Temp 98.5°F | Ht 67.0 in | Wt 244.6 lb

## 2023-02-05 DIAGNOSIS — G4730 Sleep apnea, unspecified: Secondary | ICD-10-CM

## 2023-02-05 DIAGNOSIS — F1290 Cannabis use, unspecified, uncomplicated: Secondary | ICD-10-CM

## 2023-02-05 DIAGNOSIS — F411 Generalized anxiety disorder: Secondary | ICD-10-CM | POA: Diagnosis not present

## 2023-02-05 DIAGNOSIS — F4310 Post-traumatic stress disorder, unspecified: Secondary | ICD-10-CM

## 2023-02-05 DIAGNOSIS — G4710 Hypersomnia, unspecified: Secondary | ICD-10-CM

## 2023-02-05 DIAGNOSIS — F329 Major depressive disorder, single episode, unspecified: Secondary | ICD-10-CM | POA: Diagnosis not present

## 2023-02-05 DIAGNOSIS — Z79899 Other long term (current) drug therapy: Secondary | ICD-10-CM

## 2023-02-05 MED ORDER — ESCITALOPRAM OXALATE 5 MG PO TABS
5.0000 mg | ORAL_TABLET | Freq: Every day | ORAL | 1 refills | Status: DC
Start: 2023-02-05 — End: 2023-06-17

## 2023-02-05 NOTE — Patient Instructions (Signed)
Escitalopram Tablets What is this medication? ESCITALOPRAM (es sye TAL oh pram) treats depression and anxiety. It increases the amount of serotonin in the brain, a hormone that helps regulate mood. It belongs to a group of medications called SSRIs. This medicine may be used for other purposes; ask your health care provider or pharmacist if you have questions. COMMON BRAND NAME(S): Lexapro, PramLyte What should I tell my care team before I take this medication? They need to know if you have any of these conditions: Bipolar disorder or a family history of bipolar disorder Diabetes Glaucoma Heart disease Kidney disease Liver disease Receiving electroconvulsive therapy Seizures Suicidal thoughts, plans, or attempt by you or a family member An unusual or allergic reaction to escitalopram, other medications, foods, dyes, or preservatives Pregnant or trying to become pregnant Breastfeeding How should I use this medication? Take this medication by mouth with a glass of water. Take it as directed on the prescription label at the same time every day. You can take it with or without food. If it upsets your stomach, take it with food. Do not take it more often than directed. Do not stop taking this medication suddenly except upon the advice of your care team. Stopping this medication too quickly may cause serious side effects or your condition may worsen. A special MedGuide will be given to you by the pharmacist with each prescription and refill. Be sure to read this information carefully each time. Talk to your care team about the use of this medication in children. Special care may be needed. Overdosage: If you think you have taken too much of this medicine contact a poison control center or emergency room at once. NOTE: This medicine is only for you. Do not share this medicine with others. What if I miss a dose? If you miss a dose, take it as soon as you can. If it is almost time for your next dose,  take only that dose. Do not take double or extra doses. What may interact with this medication? Do not take this medication with any of the following: Certain medications for fungal infections, such as fluconazole, itraconazole, ketoconazole, posaconazole, voriconazole Cisapride Citalopram Dronedarone Linezolid MAOIs, such as Carbex, Eldepryl, Marplan, Nardil, and Parnate Methylene blue (injected into a vein) Pimozide Thioridazine This medication may also interact with the following: Alcohol Amphetamines Aspirin and aspirin-like medications Carbamazepine Certain medications for mental health conditions Certain medications for migraine headache, such as almotriptan, eletriptan, frovatriptan, naratriptan, rizatriptan, sumatriptan, zolmitriptan Certain medications for sleep Certain medications that treat or prevent blood clots, such as warfarin, enoxaparin, dalteparin Cimetidine Diuretics Dofetilide Fentanyl Furazolidone Isoniazid Lithium Metoprolol NSAIDs, medications for pain and inflammation, such as ibuprofen or naproxen Other medications that cause heart rhythm changes Procarbazine Rasagiline Supplements, such as St. John's wort, kava kava, valerian Tramadol Tryptophan Ziprasidone This list may not describe all possible interactions. Give your health care provider a list of all the medicines, herbs, non-prescription drugs, or dietary supplements you use. Also tell them if you smoke, drink alcohol, or use illegal drugs. Some items may interact with your medicine. What should I watch for while using this medication? Tell your care team if your symptoms do not get better or if they get worse. Visit your care team for regular checks on your progress. Because it may take several weeks to see the full effects of this medication, it is important to continue your treatment as prescribed by your care team. Watch for new or worsening thoughts of suicide or  depression. This includes  sudden changes in mood, behaviors, or thoughts. These changes can happen at any time but are more common in the beginning of treatment or after a change in dose. Call your care team right away if you experience these thoughts or worsening depression. This medication may cause mood and behavior changes, such as anxiety, nervousness, irritability, hostility, restlessness, excitability, hyperactivity, or trouble sleeping. These changes can happen at any time but are more common in the beginning of treatment or after a change in dose. Call your care team right away if you notice any of these symptoms. This medication may affect your coordination, reaction time, or judgment. Do not drive or operate machinery until you know how this medication affects you. Sit up or stand slowly to reduce the risk of dizzy or fainting spells. Drinking alcohol with this medication can increase the risk of these side effects. Your mouth may get dry. Chewing sugarless gum or sucking hard candy and drinking plenty of water may help. Contact your care team if the problem does not go away or is severe. What side effects may I notice from receiving this medication? Side effects that you should report to your care team as soon as possible: Allergic reactions--skin rash, itching, hives, swelling of the face, lips, tongue, or throat Bleeding--bloody or black, tar-like stools, red or dark brown urine, vomiting blood or brown material that looks like coffee grounds, small, red or purple spots on skin, unusual bleeding or bruising Heart rhythm changes--fast or irregular heartbeat, dizziness, feeling faint or lightheaded, chest pain, trouble breathing Low sodium level--muscle weakness, fatigue, dizziness, headache, confusion Serotonin syndrome--irritability, confusion, fast or irregular heartbeat, muscle stiffness, twitching muscles, sweating, high fever, seizure, chills, vomiting, diarrhea Sudden eye pain or change in vision such as blurry  vision, seeing halos around lights, vision loss Thoughts of suicide or self-harm, worsening mood, feelings of depression Side effects that usually do not require medical attention (report to your care team if they continue or are bothersome): Change in sex drive or performance Diarrhea Excessive sweating Nausea Tremors or shaking Upset stomach This list may not describe all possible side effects. Call your doctor for medical advice about side effects. You may report side effects to FDA at 1-800-FDA-1088. Where should I keep my medication? Keep out of reach of children and pets. Store at room temperature between 15 and 30 degrees C (59 and 86 degrees F). Throw away any unused medication after the expiration date. NOTE: This sheet is a summary. It may not cover all possible information. If you have questions about this medicine, talk to your doctor, pharmacist, or health care provider.  2024 Elsevier/Gold Standard (2022-02-04 00:00:00)

## 2023-02-05 NOTE — Progress Notes (Unsigned)
[facility] MD OP Progress Note  02/05/2023 3:57 PM Brittney [name]  MRN:  [id number]  Chief Complaint:  Chief Complaint  Patient presents with   Follow-up   Depression   Headache   Insomnia   Medication Refill   HPI: Brittney [name] is a biracial female, currently employed, lives with boyfriend, has a history of PTSD, MDD, GAD, long-term use of cannabis, hypersomnia with recent diagnosis of sleep apnea currently not using the CPAP, morbid obesity, was evaluated in office today for a follow-up appointment.  Patient today reports she continues to struggle with sadness, low motivation, excessive sleepiness during the day, low energy as well as anxiety symptoms, worrying about things, being on edge, trouble relaxing.  She reports she could not take her Zoloft since it made her 'zoned out'.  Patient reports she tried taking it for a month and the side effect did not get better.  Patient was diagnosed with sleep apnea recently, reviewed notes per Dr.Sood, pulmonology.  Patient reports she was prescribed CPAP device however she has a dental issue right now and is unable to use the mask, since she has a sensitive tooth and it is difficult because of the cold air that comes in through the mask.  She hence is waiting for her dental appointment.  She continues to struggle with excessive sleepiness during the day which is a constant problem for her.  Patient does report thoughts of not wanting to be here although she denies any active suicidality or plan.  She reports her children as a positive factor in her life.  She has not been able to find a therapist yet however is motivated to do so.  She denies any other concerns today.  Visit Diagnosis:    ICD-10-CM   1. PTSD (post-traumatic stress disorder)  F43.10 escitalopram (LEXAPRO) 5 MG tablet    2. MDD (major depressive disorder), single episode with atypical features  F32.9 escitalopram (LEXAPRO) 5 MG tablet    3. GAD (generalized anxiety disorder)  F41.1  escitalopram (LEXAPRO) 5 MG tablet    4. Long term current use of cannabis  F12.90     5. Hypersomnia with sleep apnea  G47.10    G47.30     6. High risk medication use  Z79.899       Past Psychiatric History: I have reviewed past psychiatric history from progress note on 09/19/2022.  Past medications-bupropion, hydroxyzine, trazodone, sertraline.  Past Medical History:  Past Medical History:  Diagnosis Date   ADHD (attention deficit hyperactivity disorder)     Past Surgical History:  Procedure Laterality Date   NO PAST SURGERIES      Family Psychiatric History: I have reviewed family psychiatric history from progress note on 09/19/2022.  Family History:  Family History  Problem Relation Age of Onset   Anxiety disorder Mother    ADD / ADHD Mother    Hypertension Mother    Hypertension Father    Depression Sister    Anxiety disorder Sister    Asthma Brother    Heart attack Maternal Aunt    Thyroid disease Maternal Aunt    Heart disease Maternal Grandfather    Diabetes Maternal Grandmother    Depression Daughter    Anxiety disorder Daughter    Asthma Daughter     Social History: I have reviewed social history from progress note on 09/19/2022. Social History   Socioeconomic History   Marital status: Single    Spouse name: Not on file   Number  of children: 2   Years of education: Not on file   Highest education level: 9th grade  Occupational History   Occupation: Refurb tech    Comment: [employer]  Tobacco Use   Smoking status: Every Day    Current packs/day: 0.50    Average packs/day: 0.5 packs/day for 17.0 years (8.5 ttl pk-yrs)    Types: Cigarettes   Smokeless tobacco: Never   Tobacco comments:    0.5 PPD -09/20/2022 khj  Vaping Use   Vaping status: Never Used  Substance and Sexual Activity   Alcohol use: Yes    Comment: socially   Drug use: Yes    Types: Marijuana    Comment: 3-4 days out of the week   Sexual activity: Yes    Partners: Male    Birth  control/protection: Implant  Other Topics Concern   Not on file  Social History Narrative   Not on file   Social Determinants of Health   Financial Resource Strain: Not on file  Food Insecurity: Not on file  Transportation Needs: Not on file  Physical Activity: Not on file  Stress: Not on file  Social Connections: Not on file    Allergies:  Allergies  Allergen Reactions   Amoxicillin-Pot Clavulanate Nausea And Vomiting    Metabolic Disorder Labs: Lab Results  Component Value Date   HGBA1C 5.6 09/25/2022   No results found for: "PROLACTIN" Lab Results  Component Value Date   CHOL 153 06/26/2022   TRIG 91 06/26/2022   HDL 42 06/26/2022   CHOLHDL 3.6 06/26/2022   LDLCALC 94 06/26/2022   Lab Results  Component Value Date   TSH 1.750 06/26/2022    Therapeutic Level Labs: No results found for: "LITHIUM" No results found for: "VALPROATE" No results found for: "CBMZ"  Current Medications: Current Outpatient Medications  Medication Sig Dispense Refill   butalbital-acetaminophen-caffeine (FIORICET) 50-325-40 MG tablet Take 1 tablet by mouth every 6 (six) hours as needed for headache. 14 tablet 0   chlorhexidine (PERIDEX) 0.12 % solution SMARTSIG:By Mouth     doxycycline (VIBRA-TABS) 100 MG tablet Take 1 tablet (100 mg total) by mouth daily. 90 tablet 3   escitalopram (LEXAPRO) 5 MG tablet Take 1 tablet (5 mg total) by mouth daily with supper. 30 tablet 1   etonogestrel (NEXPLANON) 68 MG IMPL implant 1 each once by Subdermal route.     mupirocin ointment (BACTROBAN) 2 % Apply 1 Application topically 2 (two) times daily. 30 g 3   Vitamin D, Ergocalciferol, (DRISDOL) 1.25 MG (50000 UNIT) CAPS capsule Take 1 capsule (50,000 Units total) by mouth every 7 (seven) days. 26 capsule 0   No current facility-administered medications for this visit.     Musculoskeletal: Strength & Muscle Tone: within normal limits Gait & Station: normal Patient leans: N/A  Psychiatric  Specialty Exam: Review of Systems  Psychiatric/Behavioral:  Positive for decreased concentration, dysphoric mood and sleep disturbance. The patient is nervous/anxious.     Blood pressure 116/76, pulse 65, temperature 98.5 F (36.9 C), temperature source Skin, height 5\' 7"  (1.702 m), weight 244 lb 9.6 oz (110.9 kg).Body mass index is 38.31 kg/m.  General Appearance: Casual  Eye Contact:  Fair  Speech:  Clear and Coherent  Volume:  Normal  Mood:  Anxious and Dysphoric  Affect:  Congruent  Thought Process:  Goal Directed and Descriptions of Associations: Intact  Orientation:  Full (Time, Place, and Person)  Thought Content: Logical   Suicidal Thoughts:  No  Homicidal  Thoughts:  No  Memory:  Immediate;   Fair Recent;   Fair Remote;   Fair  Judgement:  Fair  Insight:  Fair  Psychomotor Activity:  Normal  Concentration:  Concentration: Fair and Attention Span: Fair  Recall:  [employer] of Knowledge: Fair  Language: Fair  Akathisia:  No  Handed:  Right  AIMS (if indicated): not done  Assets:  Communication Skills Desire for Improvement Housing Social Support  ADL's:  Intact  Cognition: WNL  Sleep:   excessive sleep   Screenings: GAD-7    Flowsheet Row Office Visit from 02/05/2023 in [facility] Office Visit from 09/19/2022 in [facility] Office Visit from 08/13/2022 in [facility]  Total GAD-7 Score 20 20 20       PHQ2-9    Flowsheet Row Office Visit from 02/05/2023 in [facility] Office Visit from 09[facility]25/2022 in [facility] Office Visit from 09/19/2022 in [facility] Office Visit from 08/13/2022 in [facility] Office Visit from 07/09/2022 in [facility]  PHQ-2 Total Score 6 6 6 6 5   PHQ-9 Total Score 20 27 22 21 22        Flowsheet Row Office Visit from 02/05/2023 in [facility] Office Visit from 09/19/2022 in [facility]  C-SSRS RISK CATEGORY Low Risk No Risk        Assessment and Plan: JAYNELLE KREIFELS is a 32 year old female with PTSD, depression, anxiety symptoms, recent diagnosis of sleep apnea, currently not compliant on the CPAP, will continue to benefit from medication readjustment, recent adverse side effects to Zoloft, noncompliance with psychotherapy sessions, will benefit from medication changes, getting established with a therapist, plan as noted below.  Plan  PTSD-unstable Discontinue sertraline for side effects Start Lexapro 5 mg p.o. daily with supper Patient advised to establish care with a therapist-advised to also see if she can be scheduled with our therapist.  MDD-unstable Start Lexapro 5 mg p.o. daily with supper Referred for CBT  GAD-unstable Start Lexapro 5 mg p.o. daily with supper  Long-term use of cannabis-patient is currently not using. Reviewed and discussed urine drug screen-09/19/2022-negative.  Hypersomnia-patient recently diagnosed with obstructive sleep apnea, currently noncompliant with CPAP.  I have reviewed notes per pulmonology-Dr.Sood as noted above.  High risk medication use-reviewed and discussed urine drug screen as noted above.  Patient is currently negative for cannabis or other illicit drugs and alcohol.    Collaboration of Care: Collaboration of Care: Referral or follow-up with counselor/therapist AEB encouraged to establish care with therapist.  Patient/Guardian was advised Release of Information must be obtained prior to any record release in order to collaborate their care with an outside provider. Patient/Guardian was advised if they have not already done so to contact the registration department to sign all necessary forms in order for [place] to release information  regarding their care.   Consent: Patient/Guardian gives verbal consent for treatment and assignment of benefits for services provided during this visit. Patient/Guardian expressed understanding and agreed to proceed.   Follow-up in clinic in 4 weeks or sooner if needed.  This note was generated in part or whole with voice recognition software. Voice recognition is usually quite accurate but there are transcription errors that can and very often do occur. I apologize for any typographical errors that were not detected and  corrected.    Jomarie Longs, MD 02/05/2023, 3:57 PM

## 2023-02-06 ENCOUNTER — Ambulatory Visit: Payer: Medicaid Other | Admitting: Dermatology

## 2023-02-11 DIAGNOSIS — G4733 Obstructive sleep apnea (adult) (pediatric): Secondary | ICD-10-CM | POA: Diagnosis not present

## 2023-02-12 ENCOUNTER — Telehealth: Payer: Self-pay

## 2023-02-12 LAB — CYTOLOGY - PAP
Chlamydia: NEGATIVE
Comment: NEGATIVE
Comment: NEGATIVE
Comment: NEGATIVE
Comment: NEGATIVE
Comment: NORMAL
Diagnosis: NEGATIVE
HSV1: NEGATIVE
HSV2: NEGATIVE
High risk HPV: NEGATIVE
Neisseria Gonorrhea: NEGATIVE
Trichomonas: NEGATIVE

## 2023-02-12 NOTE — Telephone Encounter (Signed)
Copied from CRM [id number]. Topic: General - Other >> Feb 12, 2023  9:04 AM Macon Large wrote: Reason for CRM: Pt requests call back to go over lab results. Cb# [phone]

## 2023-02-12 NOTE — Telephone Encounter (Signed)
PAP still pending results

## 2023-02-12 NOTE — Telephone Encounter (Signed)
I only see a pap smear pending?

## 2023-02-21 DIAGNOSIS — R0789 Other chest pain: Secondary | ICD-10-CM | POA: Diagnosis not present

## 2023-02-21 DIAGNOSIS — R079 Chest pain, unspecified: Secondary | ICD-10-CM | POA: Diagnosis not present

## 2023-03-03 ENCOUNTER — Ambulatory Visit (INDEPENDENT_AMBULATORY_CARE_PROVIDER_SITE_OTHER): Payer: Medicaid Other | Admitting: Psychiatry

## 2023-03-03 DIAGNOSIS — F4310 Post-traumatic stress disorder, unspecified: Secondary | ICD-10-CM

## 2023-03-04 NOTE — Progress Notes (Signed)
Patient did not show up for appointment.

## 2023-03-26 DIAGNOSIS — Z20822 Contact with and (suspected) exposure to covid-19: Secondary | ICD-10-CM | POA: Diagnosis not present

## 2023-03-26 DIAGNOSIS — U071 COVID-19: Secondary | ICD-10-CM | POA: Diagnosis not present

## 2023-04-16 ENCOUNTER — Ambulatory Visit: Payer: Medicaid Other | Admitting: Dermatology

## 2023-05-02 ENCOUNTER — Ambulatory Visit: Payer: Medicaid Other | Admitting: Family Medicine

## 2023-05-12 DIAGNOSIS — H6691 Otitis media, unspecified, right ear: Secondary | ICD-10-CM | POA: Diagnosis not present

## 2023-05-15 ENCOUNTER — Ambulatory Visit: Payer: Medicaid Other | Admitting: Professional Counselor

## 2023-06-04 ENCOUNTER — Ambulatory Visit: Payer: Medicaid Other | Admitting: Professional Counselor

## 2023-06-17 ENCOUNTER — Ambulatory Visit: Payer: Medicaid Other | Admitting: Psychiatry

## 2023-06-17 ENCOUNTER — Encounter: Payer: Self-pay | Admitting: Psychiatry

## 2023-06-17 VITALS — BP 108/67 | HR 87 | Temp 98.1°F | Ht 67.0 in | Wt 230.4 lb

## 2023-06-17 DIAGNOSIS — F411 Generalized anxiety disorder: Secondary | ICD-10-CM | POA: Diagnosis not present

## 2023-06-17 DIAGNOSIS — F331 Major depressive disorder, recurrent, moderate: Secondary | ICD-10-CM

## 2023-06-17 DIAGNOSIS — F4310 Post-traumatic stress disorder, unspecified: Secondary | ICD-10-CM | POA: Diagnosis not present

## 2023-06-17 DIAGNOSIS — F1290 Cannabis use, unspecified, uncomplicated: Secondary | ICD-10-CM | POA: Diagnosis not present

## 2023-06-17 MED ORDER — HYDROXYZINE HCL 25 MG PO TABS: 12.5000 mg | ORAL_TABLET | Freq: Two times a day (BID) | ORAL | 1 refills | Status: DC | PRN

## 2023-06-17 MED ORDER — ESCITALOPRAM OXALATE 10 MG PO TABS
10.0000 mg | ORAL_TABLET | Freq: Every day | ORAL | 1 refills | Status: DC
Start: 2023-06-17 — End: 2024-03-30
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
## 2023-06-17 NOTE — Patient Instructions (Signed)
 Escitalopram Tablets What is this medication? ESCITALOPRAM (es sye TAL oh pram) treats depression and anxiety. It increases the amount of serotonin in the brain, a hormone that helps regulate mood. It belongs to a group of medications called SSRIs. This medicine may be used for other purposes; ask your health care provider or pharmacist if you have questions. COMMON BRAND NAME(S): Lexapro What should I tell my care team before I take this medication? They need to know if you have any of these conditions: Bipolar disorder or a family history of bipolar disorder Diabetes Glaucoma Heart disease Kidney disease Liver disease Receiving electroconvulsive therapy Seizures Suicidal thoughts, plans, or attempt by you or a family member An unusual or allergic reaction to escitalopram, other medications, foods, dyes, or preservatives Pregnant or trying to become pregnant Breastfeeding How should I use this medication? Take this medication by mouth with a glass of water. Take it as directed on the prescription label at the same time every day. You can take it with or without food. If it upsets your stomach, take it with food. Do not take it more often than directed. Do not stop taking this medication suddenly except upon the advice of your care team. Stopping this medication too quickly may cause serious side effects or your condition may worsen. A special MedGuide will be given to you by the pharmacist with each prescription and refill. Be sure to read this information carefully each time. Talk to your care team about the use of this medication in children. Special care may be needed. Overdosage: If you think you have taken too much of this medicine contact a poison control center or emergency room at once. NOTE: This medicine is only for you. Do not share this medicine with others. What if I miss a dose? If you miss a dose, take it as soon as you can. If it is almost time for your next dose, take only  that dose. Do not take double or extra doses. What may interact with this medication? Do not take this medication with any of the following: Certain medications for fungal infections, such as fluconazole, itraconazole, ketoconazole, posaconazole, voriconazole Cisapride Citalopram Dronedarone Linezolid MAOIs, such as Carbex, Eldepryl, Marplan, Nardil, and Parnate Methylene blue (injected into a vein) Pimozide Thioridazine This medication may also interact with the following: Alcohol Amphetamines Aspirin and aspirin-like medications Carbamazepine Certain medications for mental health conditions Certain medications for migraine headache, such as almotriptan, eletriptan, frovatriptan, naratriptan, rizatriptan, sumatriptan, zolmitriptan Certain medications for sleep Certain medications that treat or prevent blood clots, such as warfarin, enoxaparin, dalteparin Cimetidine Diuretics Dofetilide Fentanyl Furazolidone Isoniazid Lithium Metoprolol NSAIDs, medications for pain and inflammation, such as ibuprofen or naproxen Other medications that cause heart rhythm changes Procarbazine Rasagiline Supplements, such as St. John's wort, kava kava, valerian Tramadol Tryptophan Ziprasidone This list may not describe all possible interactions. Give your health care provider a list of all the medicines, herbs, non-prescription drugs, or dietary supplements you use. Also tell them if you smoke, drink alcohol, or use illegal drugs. Some items may interact with your medicine. What should I watch for while using this medication? Tell your care team if your symptoms do not get better or if they get worse. Visit your care team for regular checks on your progress. Because it may take several weeks to see the full effects of this medication, it is important to continue your treatment as prescribed by your care team. Watch for new or worsening thoughts of suicide or depression.  This includes sudden  changes in mood, behaviors, or thoughts. These changes can happen at any time but are more common in the beginning of treatment or after a change in dose. Call your care team right away if you experience these thoughts or worsening depression. This medication may cause mood and behavior changes, such as anxiety, nervousness, irritability, hostility, restlessness, excitability, hyperactivity, or trouble sleeping. These changes can happen at any time but are more common in the beginning of treatment or after a change in dose. Call your care team right away if you notice any of these symptoms. This medication may affect your coordination, reaction time, or judgment. Do not drive or operate machinery until you know how this medication affects you. Sit up or stand slowly to reduce the risk of dizzy or fainting spells. Drinking alcohol with this medication can increase the risk of these side effects. Your mouth may get dry. Chewing sugarless gum or sucking hard candy and drinking plenty of water may help. Contact your care team if the problem does not go away or is severe. What side effects may I notice from receiving this medication? Side effects that you should report to your care team as soon as possible: Allergic reactions--skin rash, itching, hives, swelling of the face, lips, tongue, or throat Bleeding--bloody or black, tar-like stools, red or dark brown urine, vomiting blood or brown material that looks like coffee grounds, small, red or purple spots on skin, unusual bleeding or bruising Heart rhythm changes--fast or irregular heartbeat, dizziness, feeling faint or lightheaded, chest pain, trouble breathing Low sodium level--muscle weakness, fatigue, dizziness, headache, confusion Serotonin syndrome--irritability, confusion, fast or irregular heartbeat, muscle stiffness, twitching muscles, sweating, high fever, seizure, chills, vomiting, diarrhea Sudden eye pain or change in vision such as blurry vision,  seeing halos around lights, vision loss Thoughts of suicide or self-harm, worsening mood, feelings of depression Side effects that usually do not require medical attention (report to your care team if they continue or are bothersome): Change in sex drive or performance Diarrhea Excessive sweating Nausea Tremors or shaking Upset stomach This list may not describe all possible side effects. Call your doctor for medical advice about side effects. You may report side effects to FDA at 1-800-FDA-1088. Where should I keep my medication? Keep out of reach of children and pets. Store at room temperature between 15 and 30 degrees C (59 and 86 degrees F). Throw away any unused medication after the expiration date. NOTE: This sheet is a summary. It may not cover all possible information. If you have questions about this medicine, talk to your doctor, pharmacist, or health care provider.  2024 Elsevier/Gold Standard (2022-02-04 00:00:00)

## 2023-06-19 ENCOUNTER — Ambulatory Visit: Payer: Medicaid Other | Admitting: Professional Counselor

## 2023-06-19 DIAGNOSIS — F411 Generalized anxiety disorder: Secondary | ICD-10-CM | POA: Diagnosis not present

## 2023-06-19 DIAGNOSIS — F4310 Post-traumatic stress disorder, unspecified: Secondary | ICD-10-CM | POA: Diagnosis not present

## 2023-06-19 DIAGNOSIS — F331 Major depressive disorder, recurrent, moderate: Secondary | ICD-10-CM | POA: Diagnosis not present
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
## 2023-06-27 ENCOUNTER — Other Ambulatory Visit (HOSPITAL_COMMUNITY)
Admission: RE | Admit: 2023-06-27 | Discharge: 2023-06-27 | Disposition: A | Discharge status: Home / Self Care | Payer: Medicaid Other | Source: Ambulatory Visit | Attending: Physician Assistant | Admitting: Physician Assistant

## 2023-06-27 ENCOUNTER — Encounter: Payer: Self-pay | Admitting: Physician Assistant

## 2023-06-27 ENCOUNTER — Ambulatory Visit (INDEPENDENT_AMBULATORY_CARE_PROVIDER_SITE_OTHER): Payer: Medicaid Other | Admitting: Physician Assistant

## 2023-06-27 VITALS — BP 114/66 | HR 81 | Ht 67.0 in | Wt 231.0 lb

## 2023-06-27 DIAGNOSIS — Z202 Contact with and (suspected) exposure to infections with a predominantly sexual mode of transmission: Secondary | ICD-10-CM

## 2023-06-27 NOTE — Progress Notes (Signed)
Established patient visit  Patient: Brittney [name]   DOB: 02-02-1991   33 y.o. Female  MRN: [id number] Visit Date: 06/27/2023  Today's healthcare provider: Debera Lat, PA-C   Chief Complaint  Patient presents with   Exposure to STD   Subjective       Discussed the use of AI scribe software for clinical note transcription with the patient, who gave verbal consent to proceed.  History of Present Illness   The patient, who is on Nexplanon for birth control, presents for STD screening following a recent sexual encounter. The patient reports no symptoms of STDs, including no abnormal vaginal discharge. The patient is proactive about her sexual health and seeks regular screenings, especially when changing sexual partners. The patient's last screening was in September. The patient denies any urinary symptoms.           06/27/2023    2:13 PM 06/19/2023    1:04 PM 06/17/2023    5:17 PM  Depression screen PHQ 2/9  Decreased Interest 3    Down, Depressed, Hopeless 3    PHQ - 2 Score 6    Altered sleeping 3    Tired, decreased energy 3    Change in appetite 0    Feeling bad or failure about yourself  3    Trouble concentrating 3    Moving slowly or fidgety/restless 2    Suicidal thoughts 2    PHQ-9 Score 22    Difficult doing work/chores        Information is confidential and restricted. Go to Review Flowsheets to unlock data.      06/27/2023    2:17 PM 06/19/2023    1:04 PM 06/17/2023    5:17 PM 02/05/2023    3:55 PM  GAD 7 : Generalized Anxiety Score  Nervous, Anxious, on Edge 3     Control/stop worrying 3     Worry too much - different things 3     Trouble relaxing 3     Restless 2     Easily annoyed or irritable 3     Afraid - awful might happen 3     Total GAD 7 Score 20     Anxiety Difficulty Very difficult        Information is confidential and restricted. Go to Review Flowsheets to unlock data.    Medications: Outpatient Medications Prior to Visit  Medication Sig    chlorhexidine (PERIDEX) 0.12 % solution SMARTSIG:By Mouth   escitalopram (LEXAPRO) 10 MG tablet Take 1 tablet (10 mg total) by mouth daily.   etonogestrel (NEXPLANON) 68 MG IMPL implant 1 each once by Subdermal route.   hydrOXYzine (ATARAX) 25 MG tablet Take 0.5-1 tablets (12.5-25 mg total) by mouth 2 (two) times daily as needed for anxiety.   mupirocin ointment (BACTROBAN) 2 % Apply 1 Application topically 2 (two) times daily.   [DISCONTINUED] butalbital-acetaminophen-caffeine (FIORICET) 50-325-40 MG tablet Take 1 tablet by mouth every 6 (six) hours as needed for headache.   [DISCONTINUED] Vitamin D, Ergocalciferol, (DRISDOL) 1.25 MG (50000 UNIT) CAPS capsule Take 1 capsule (50,000 Units total) by mouth every 7 (seven) days.   No facility-administered medications prior to visit.    Review of Systems All negative Except see HPI       Objective    BP 114/66   Pulse 81   Ht 5\' 7"  (1.702 m)   Wt 231 lb (104.8 kg)   BMI 36.18 kg/m     Physical Exam Vitals reviewed.  Constitutional:      General: She is not in acute distress.    Appearance: She is well-developed.  HENT:     Head: Normocephalic and atraumatic.  Eyes:     General: No scleral icterus.    Conjunctiva/sclera: Conjunctivae normal.  Cardiovascular:     Rate and Rhythm: Normal rate and regular rhythm.     Heart sounds: Normal heart sounds. No murmur heard. Pulmonary:     Effort: Pulmonary effort is normal. No respiratory distress.     Breath sounds: Normal breath sounds. No wheezing or rales.  Abdominal:     General: There is no distension.     Palpations: Abdomen is soft.     Tenderness: There is no abdominal tenderness. There is no guarding or rebound.  Skin:    General: Skin is warm and dry.     Capillary Refill: Capillary refill takes less than 2 seconds.     Findings: No rash.  Neurological:     Mental Status: She is alert and oriented to person, place, and time.  Psychiatric:        Behavior: Behavior  normal.      No results found for any visits on 06/27/23.      Assessment and Plan   1. Encounter for assessment of STD exposure (Primary) - Cervicovaginal ancillary only - POCT urinalysis dipstick  Sexual Health Patient is proactive in getting tested for sexually transmitted infections (STIs) after changing partners. No symptoms of vaginal discharge reported. -Perform tests for gonorrhea, chlamydia, trichomoniasis, bacterial vaginosis, and yeast infection. -Perform urine dipstick test to rule out any related infections.  Birth Control Patient is currently using Nexplanon for birth control. -Continue current birth control method.  General Health Maintenance Last physical examination was in September of the previous year. -Schedule annual physical examination for September 2025.      Orders Placed This Encounter  Procedures   POCT urinalysis dipstick    No follow-ups on file.   The patient was advised to call back or seek an in-person evaluation if the symptoms worsen or if the condition fails to improve as anticipated.  I discussed the assessment and treatment plan with the patient. The patient was provided an opportunity to ask questions and all were answered. The patient agreed with the plan and demonstrated an understanding of the instructions.  I, Debera Lat, PA-C have reviewed all documentation for this visit. The documentation on 06/27/2023  for the exam, diagnosis, procedures, and orders are all accurate and complete.  Debera Lat, [facility], MMS [facility] [phone] (phone) [phone] (fax)  [facility]

## 2023-06-28 LAB — POCT URINALYSIS DIPSTICK

## 2023-07-01 LAB — CERVICOVAGINAL ANCILLARY ONLY
Bacterial Vaginitis (gardnerella): POSITIVE — AB
Candida Glabrata: NEGATIVE
Candida Vaginitis: NEGATIVE
Chlamydia: NEGATIVE
Comment: NEGATIVE
Comment: NEGATIVE
Comment: NEGATIVE
Comment: NEGATIVE
Comment: NEGATIVE
Comment: NORMAL
Neisseria Gonorrhea: NEGATIVE
Trichomonas: NEGATIVE

## 2023-07-02 ENCOUNTER — Other Ambulatory Visit: Payer: Self-pay | Admitting: Physician Assistant

## 2023-07-02 ENCOUNTER — Encounter: Payer: Self-pay | Admitting: Physician Assistant

## 2023-07-02 DIAGNOSIS — N760 Acute vaginitis: Secondary | ICD-10-CM

## 2023-07-02 MED ORDER — METRONIDAZOLE 500 MG PO TABS: 500.0000 mg | ORAL_TABLET | Freq: Two times a day (BID) | ORAL | 0 refills | Status: AC

## 2023-07-14 ENCOUNTER — Ambulatory Visit (INDEPENDENT_AMBULATORY_CARE_PROVIDER_SITE_OTHER): Payer: Medicaid Other | Admitting: Professional Counselor

## 2023-07-14 DIAGNOSIS — F331 Major depressive disorder, recurrent, moderate: Secondary | ICD-10-CM

## 2023-07-14 DIAGNOSIS — F411 Generalized anxiety disorder: Secondary | ICD-10-CM

## 2023-07-14 NOTE — Progress Notes (Signed)
   THERAPIST PROGRESS NOTE  Session Time: 2:50 PM - 3:45 PM   Participation Level: Active  Behavioral Response: CasualAlertAnxious  Type of Therapy: Individual Therapy  Treatment Goals addressed: Active OP Depression  LTG: "When I'm more healed and content with myself. Like my train of thought, my thought process, how I look at things. Because that's my biggest enemy, the thoughts in my head and what ifs."     Start:  07/14/23    Expected End:  07/12/24     STG: "I could think of something and then contemplate on it. I think of the what ifs. Then sometimes when I make decisions than it's the guilt." To improve mindset AEB identifying and restructuring maladaptive patterns of thinking over the next 90 days.   STG: "I want to be more confident in myself." To improve core beliefs about self AEB identifying negative beliefs and using daily affirmations to build positive beliefs over the next 90 days.    STG: "I don't want to be a doormat, where people take my kindness as a weakness." To improve assertiveness AEB identifying healthy boundaries, role-playing interpersonal effectiveness skills, and self-report in assertive communication.    ProgressTowards Goals: Initial  Interventions: CBT and Motivational Interviewing  Summary: Brittney [name] is a 33 y.o. female who presents with a history of MDD, GAD, and PTSD. She appeared alert and oriented x5. She stated things are the same. She engaged in developing her treatment plan. Brittney [name] advised she has not done therapy. She actively listened to coping skills and engaged in 5-4-3-2-1 grounding exercise. She was receptive to the cycle of anxiety/avoidance and noted the ways it shows up for her, also with grief and trauma. Brittney [name] engaged in writing exercise. She was able to identify things within and outside of her control. She was receptive to feedback from Conservation officer, nature. Brittney [name] will begin practicing coping skills and focusing on her donut between now and next  session.   Therapist Response: Conducted session with Brittney [name]. Began session with check-in/update since previous session. Utilized empathetic and reflective listening. Developed treatment plan with input from [facility] on current strengths, needs, and progress towards goals. Used open-ended questions to facilitate discussion. Explained breathing exercises, coping skills, and engaged Brittney [name] in 5-4-3-2-1 grounding exercise. Engaged Brittney [name] in writing exercise, donut/circles of control. Assisted with identifying things within/outside of Brittney [name]'s control. Encouraged Brittney [name] to practice coping skills and focus on her donut between now and next session. Confirmed next session, scheduled additional appointment, and concluded session.   Suicidal/Homicidal: No  Plan: Return again in 2 weeks.  Diagnosis: MDD (major depressive disorder), recurrent episode, moderate (HCC)  GAD (generalized anxiety disorder)  Collaboration of Care: Medication Management AEB chart review  Patient/Guardian was advised Release of Information must be obtained prior to any record release in order to collaborate their care with an outside provider. Patient/Guardian was advised if they have not already done so to contact the registration department to sign all necessary forms in order for [place] to release information regarding their care.   Consent: Patient/Guardian gives verbal consent for treatment and assignment of benefits for services provided during this visit. Patient/Guardian expressed understanding and agreed to proceed.   Edmonia Lynch, [facility] 07/14/2023

## 2023-07-22 ENCOUNTER — Encounter: Payer: Self-pay | Admitting: Physician Assistant

## 2023-07-22 ENCOUNTER — Ambulatory Visit: Payer: Self-pay | Admitting: Physician Assistant

## 2023-07-22 DIAGNOSIS — B3731 Acute candidiasis of vulva and vagina: Secondary | ICD-10-CM

## 2023-07-22 MED ORDER — FLUCONAZOLE 150 MG PO TABS: ORAL_TABLET | ORAL | 1 refills | Status: DC

## 2023-07-22 NOTE — Telephone Encounter (Signed)
 Copied from CRM [id number]. Topic: Clinical - Medical Advice >> Jul 22, 2023 11:35 AM Brittney [name] wrote: Reason for CRM: patient called to see if she could get a medication for a yeast infection as she just finished an antibiotic. Please f/u with patient   Chief Complaint: suspected yeast infection Symptoms: vaginal irritation and "tingle"/burning to posterior end of vulva/opening to vagina with urination Frequency: continual, intermittent Pertinent Negatives: Patient denies vaginal itching, change in appearance or odor, changes to vaginal discharge, abdominal pain, back pain, pain at urethra, "pain" to the area Disposition: [] 911 / [] ED /[] Urgent Care (no appt availability in office) / [] Appointment(In office/virtual)/ []  [place] [facility]/ [] Home Care/ [x] Refused Recommended Disposition /[] [place]/ [x]  Follow-up with PCP Additional Notes: Pt reporting that she was recently on an antibx and "any time I take an antibx, gives me a yeast infection" and confirms she is experiencing her usual symptom of a "tingle" rather than pain with urination to posterior end of vaginal opening, "know that something's off, something ain'[name] right down there." Pt reporting the pain/discomfort is "between mild and moderate." Pt confirms no itching, odor, changes to discharge or appearance. Pt reporting the discomfort feels like "irritation like when I put on clothes and stuff." Pt does not feel need for appt at this time, but will come in for appt if needed, requesting "whatever they normally prescribed me, it's only one pill." Advised that nurse will send HP message with request for med and she should receive call back. Pt verbalized understanding.  Reason for Disposition  [1] MILD-MODERATE pain AND [2] present > 24 hours  (Exception: Chronic pain.)  Answer Assessment - Initial Assessment Questions 1. SYMPTOM: "What's the main symptom you're concerned about?" (e.g., pain, itching, dryness)    like  tingle with urination Yes like a burning with urination, discomfort With little bit irritation like when put clothes on and stuff 2. LOCATION: "Where is the  feeling located?" (e.g., inside/outside, left/right)     Down towards bottom of vulva 3. ONSET: "When did the  feeling  start?"     yesterday 4. PAIN: "Is there any pain?" If Yes, ask: "How bad is it?" (Scale: 1-10; mild, moderate, severe)   -  MILD (1-3): Doesn'[name] interfere with normal activities.    -  MODERATE (4-7): Interferes with normal activities (e.g., work or school) or awakens from sleep.     -  SEVERE (8-10): Excruciating pain, unable to do any normal activities.     Discomfort 7/10 between mild and moderate, know that something's off something ain'[name] right down there 5. ITCHING: "Is there any itching?" If Yes, ask: "How bad is it?" (Scale: 1-10; mild, moderate, severe)     no 6. CAUSE: "What do you think is causing the discharge?" "Have you had the same problem before? What happened then?"     Any time take antibiotic usually gives me yeast infection, this is her usual symptom 7. OTHER SYMPTOMS: "Do you have any other symptoms?" (e.g., fever, itching, vaginal bleeding, pain with urination, injury to genital area, vaginal foreign body)     no 8. PREGNANCY: "Is there any chance you are pregnant?" "When was your last menstrual period?"     no  Protocols used: Vaginal Symptoms-A-AH

## 2023-07-22 NOTE — Addendum Note (Signed)
 Addended by: Debera Lat on: 07/22/2023 08:21 PM   Modules accepted: Orders

## 2023-07-28 ENCOUNTER — Ambulatory Visit (INDEPENDENT_AMBULATORY_CARE_PROVIDER_SITE_OTHER): Payer: Medicaid Other | Admitting: Professional Counselor

## 2023-07-28 DIAGNOSIS — Z91199 Patient's noncompliance with other medical treatment and regimen due to unspecified reason: Secondary | ICD-10-CM

## 2023-07-28 NOTE — Progress Notes (Signed)
 Patient no-showed today's appointment; appointment was for 07/28/23 at 3 PM for follow-up outpatient therapy.

## 2023-07-30 ENCOUNTER — Ambulatory Visit: Payer: Medicaid Other | Admitting: Psychiatry

## 2023-08-11 ENCOUNTER — Ambulatory Visit (INDEPENDENT_AMBULATORY_CARE_PROVIDER_SITE_OTHER): Payer: Medicaid Other | Admitting: Professional Counselor

## 2023-08-11 DIAGNOSIS — Z91199 Patient's noncompliance with other medical treatment and regimen due to unspecified reason: Secondary | ICD-10-CM

## 2023-08-11 NOTE — Progress Notes (Signed)
 Patient no-showed today's appointment; appointment was for 08/11/23 at 3 PM for follow-up outpatient therapy. Sent letter about possible discharge due to consecutive no-show.

## 2023-08-25 ENCOUNTER — Ambulatory Visit: Admitting: Professional Counselor

## 2023-10-05 DIAGNOSIS — S63501A Unspecified sprain of right wrist, initial encounter: Secondary | ICD-10-CM | POA: Diagnosis not present

## 2023-10-05 DIAGNOSIS — M25511 Pain in right shoulder: Secondary | ICD-10-CM | POA: Diagnosis not present

## 2023-10-05 DIAGNOSIS — M25531 Pain in right wrist: Secondary | ICD-10-CM | POA: Diagnosis not present

## 2023-10-08 ENCOUNTER — Ambulatory Visit: Payer: Self-pay
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
## 2023-10-09 ENCOUNTER — Ambulatory Visit: Admitting: Family Medicine

## 2023-10-09 ENCOUNTER — Encounter: Payer: Self-pay | Admitting: Family Medicine

## 2023-10-09 VITALS — BP 113/68 | HR 79 | Ht 67.0 in | Wt 229.8 lb

## 2023-10-09 DIAGNOSIS — S63501D Unspecified sprain of right wrist, subsequent encounter: Secondary | ICD-10-CM

## 2023-10-09 DIAGNOSIS — R200 Anesthesia of skin: Secondary | ICD-10-CM | POA: Diagnosis not present

## 2023-10-09 DIAGNOSIS — R202 Paresthesia of skin: Secondary | ICD-10-CM
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
## 2024-03-30 ENCOUNTER — Ambulatory Visit (INDEPENDENT_AMBULATORY_CARE_PROVIDER_SITE_OTHER): Admitting: Physician Assistant

## 2024-03-30 ENCOUNTER — Other Ambulatory Visit (HOSPITAL_COMMUNITY)
Admission: RE | Admit: 2024-03-30 | Discharge: 2024-03-30 | Disposition: A | Discharge status: Home / Self Care | Source: Ambulatory Visit | Attending: Physician Assistant | Admitting: Physician Assistant

## 2024-03-30 ENCOUNTER — Ambulatory Visit: Admitting: Physician Assistant

## 2024-03-30 ENCOUNTER — Encounter: Payer: Self-pay | Admitting: Physician Assistant

## 2024-03-30 VITALS — BP 122/78 | HR 77 | Ht 67.0 in | Wt 238.7 lb

## 2024-03-30 DIAGNOSIS — L732 Hidradenitis suppurativa: Secondary | ICD-10-CM | POA: Diagnosis not present

## 2024-03-30 DIAGNOSIS — G4733 Obstructive sleep apnea (adult) (pediatric): Secondary | ICD-10-CM

## 2024-03-30 DIAGNOSIS — F4310 Post-traumatic stress disorder, unspecified: Secondary | ICD-10-CM

## 2024-03-30 DIAGNOSIS — R0683 Snoring: Secondary | ICD-10-CM

## 2024-03-30 DIAGNOSIS — B9689 Other specified bacterial agents as the cause of diseases classified elsewhere: Secondary | ICD-10-CM

## 2024-03-30 DIAGNOSIS — F411 Generalized anxiety disorder: Secondary | ICD-10-CM

## 2024-03-30 DIAGNOSIS — Z113 Encounter for screening for infections with a predominantly sexual mode of transmission: Secondary | ICD-10-CM | POA: Diagnosis not present

## 2024-03-30 DIAGNOSIS — F17210 Nicotine dependence, cigarettes, uncomplicated: Secondary | ICD-10-CM

## 2024-03-30 DIAGNOSIS — F39 Unspecified mood [affective] disorder: Secondary | ICD-10-CM

## 2024-03-30 DIAGNOSIS — E559 Vitamin D deficiency, unspecified: Secondary | ICD-10-CM

## 2024-03-30 DIAGNOSIS — J3089 Other allergic rhinitis: Secondary | ICD-10-CM

## 2024-03-30 DIAGNOSIS — F1220 Cannabis dependence, uncomplicated: Secondary | ICD-10-CM

## 2024-03-30 DIAGNOSIS — R7303 Prediabetes: Secondary | ICD-10-CM

## 2024-03-30 DIAGNOSIS — E669 Obesity, unspecified: Secondary | ICD-10-CM | POA: Diagnosis not present

## 2024-03-30 DIAGNOSIS — E049 Nontoxic goiter, unspecified: Secondary | ICD-10-CM

## 2024-03-30 DIAGNOSIS — N760 Acute vaginitis: Secondary | ICD-10-CM

## 2024-03-30 NOTE — Progress Notes (Signed)
 Established patient visit  Patient: Brittney [name]   DOB: 11/02/1990   33 y.o. Female  MRN: [id number] Visit Date: 03/30/2024  Today's healthcare provider: Jolynn Spencer, PA-C   Chief Complaint  Patient presents with   Med conditions   SEXUALLY TRANSMITTED DISEASE    Would like to be tested as she is sexually active. No symptoms    Subjective     HPI     SEXUALLY TRANSMITTED DISEASE    Additional comments: Would like to be tested as she is sexually active. No symptoms       Last edited by Wilfred Hargis RAMAN, CMA on 03/30/2024  8:16 AM.       Discussed the use of AI scribe software for clinical note transcription with the patient, who gave verbal consent to proceed.  History of Present Illness Brittney [name] is a 32 year old female who presents for follow-up and management of multiple health concerns.  Hidradenitis suppurativa was diagnosed by a dermatologist, and she is not on any specific treatment regimen. Her mental health conditions include anxiety, depression, and PTSD. She feels improved with lifestyle changes and church attendance, and she is not on medication for these conditions. Occasional difficult days are acknowledged.  Vitamin D  deficiency requires further investigation. There is a family history of sleep apnea in both parents. She previously used a CPAP machine but stopped due to dental issues and has not resumed its use.  She smokes less than half a pack of cigarettes per day and is gradually reducing marijuana use, currently at one joint per day. She reports weight gain after COVID-19 and recognizes the need for dietary and exercise improvements.  Menstrual periods are regular with some cramping but no significant pain or heavy bleeding. Recent nasal congestion and wheezing followed a cold last week, attributed to post-nasal drainage, with no current antihistamine use.       03/30/2024    8:17 AM 10/09/2023   10:49 AM 06/27/2023    2:13 PM  Depression screen PHQ  2/9  Decreased Interest 0 3 3  Down, Depressed, Hopeless 0 3 3  PHQ - 2 Score 0 6 6  Altered sleeping 0 3 3  Tired, decreased energy 0 3 3  Change in appetite 0 0 0  Feeling bad or failure about yourself  0 3 3  Trouble concentrating 0 2 3  Moving slowly or fidgety/restless 0 2 2  Suicidal thoughts 0 2 2  PHQ-9 Score 0 21  22   Difficult doing work/chores  Very difficult      Data saved with a previous flowsheet row definition      10/09/2023   10:49 AM 06/27/2023    2:17 PM 06/19/2023    1:04 PM 06/17/2023    5:17 PM  GAD 7 : Generalized Anxiety Score  Nervous, Anxious, on Edge 3 3    Control/stop worrying 3 3    Worry too much - different things 3 3    Trouble relaxing 2 3    Restless 2 2    Easily annoyed or irritable 3 3    Afraid - awful might happen 3 3    Total GAD 7 Score 19 20    Anxiety Difficulty Very difficult Very difficult       Information is confidential and restricted. Go to Review Flowsheets to unlock data.    Medications: Outpatient Medications Prior to Visit  Medication Sig   chlorhexidine (PERIDEX) 0.12 % solution SMARTSIG:By Mouth (Patient  not taking: Reported on 10/09/2023)   escitalopram  (LEXAPRO ) 10 MG tablet Take 1 tablet (10 mg total) by mouth daily.   etonogestrel (NEXPLANON) 68 MG IMPL implant 1 each once by Subdermal route.   fluconazole  (DIFLUCAN ) 150 MG tablet 150 mg orally for 2 doses; give the second dose 72 hours after the first dose (Patient not taking: Reported on 10/09/2023)   hydrOXYzine  (ATARAX ) 25 MG tablet Take 0.5-1 tablets (12.5-25 mg total) by mouth 2 (two) times daily as needed for anxiety. (Patient not taking: Reported on 10/09/2023)   mupirocin  ointment (BACTROBAN ) 2 % Apply 1 Application topically 2 (two) times daily. (Patient not taking: Reported on 10/09/2023)   No facility-administered medications prior to visit.    Review of Systems All negative Except see HPI       Objective    BP 122/78   Pulse 77   Ht 5' 7  (1.702 m)   Wt 238 lb 11.2 oz (108.3 kg)   SpO2 100%   BMI 37.39 kg/m     Physical Exam Vitals reviewed.  Constitutional:      General: She is not in acute distress.    Appearance: Normal appearance. She is well-developed. She is not diaphoretic.  HENT:     Head: Normocephalic and atraumatic.  Eyes:     General: No scleral icterus.    Conjunctiva/sclera: Conjunctivae normal.  Neck:     Thyroid: No thyromegaly.  Cardiovascular:     Rate and Rhythm: Normal rate and regular rhythm.     Pulses: Normal pulses.     Heart sounds: Normal heart sounds. No murmur heard. Pulmonary:     Effort: Pulmonary effort is normal. No respiratory distress.     Breath sounds: Normal breath sounds. No wheezing, rhonchi or rales.  Musculoskeletal:     Cervical back: Neck supple.     Right lower leg: No edema.     Left lower leg: No edema.  Lymphadenopathy:     Cervical: No cervical adenopathy.  Skin:    General: Skin is warm and dry.     Findings: No rash.  Neurological:     Mental Status: She is alert and oriented to person, place, and time. Mental status is at baseline.  Psychiatric:        Mood and Affect: Mood normal.        Behavior: Behavior normal.      No results found for any visits on 03/30/24.    Assessment & Plan  Hidradenitis suppurativa Chronic, currently stable Diagnosed by dermatologist. Requires ongoing management. - Reestablish care with dermatologist for ongoing management.  Obesity Chronic and stable Body mass index is 37.39 kg/m. Weight gain post-COVID. Discussed diet and exercise importance. Insurance coverage for nutritionist class uncertain. - Recommended nutritionist class if insurance covers it. - Encouraged diet and exercise improvements. Labwork ordered, see below Will follow-up  Obstructive sleep apnea , mild Chronic and untreated - Recommended to continue using CPAP machine  Advised to see pulmonology Consider a referral to sleep study  specialist. Will follow-up   Vitamin D  deficiency Previous deficiency noted. Plan to assess current status with blood work. - Ordered vitamin D  level as part of blood work.  Nicotine/tobacco and cannabis dependence Chronic Long-term nicotine use and reduced cannabis use. Attempting to quit both. - Encouraged smoking cessation and reduction of cannabis use.  Depression, generalized anxiety disorder, and PTSD Chronic and stable, per pt Improvement in symptoms with lifestyle changes and spiritual support. No current medication. -  Provided information on urgent care for mental health emergencies.  Nontoxic goiter (unspecified) Possible thyroid enlargement. Plan to investigate with labs and imaging. - Ordered thyroid function tests. - Recommended thyroid imaging if indicated by labs.  Allergic rhinitis with postnasal drainage Symptoms suggestive of allergic rhinitis. Discussed nasal sprays and antihistamines. - Recommended nasal spray (Flonase or Nasacort ). - Advised use of antihistamines for postnasal drainage.  Screening for sexually transmitted infections STI screening planned for routine health maintenance. - Ordered STI screening including RPR.   Screening for STD (sexually transmitted disease) - Cervicovaginal ancillary only - HIV antibody (with reflex) - RPR w/reflex to TrepSure  Obesity (BMI 30-39.9)  - CBC with Differential/Platelet - Comprehensive metabolic panel with GFR - Hemoglobin A1c - Lipid panel - TSH - T4, free  Avitaminosis D  - Vitamin D  (25 hydroxy)   Goiter  - US  THYROID; Future  Follow up Annual wellness visit planned for health assessment and screenings. - Will schedule annual wellness visit within 2-4 weeks. - Will perform Pap smear during annual wellness visit.  No orders of the defined types were placed in this encounter.   No follow-ups on file.   The patient was advised to call back or seek an in-person evaluation if the symptoms  worsen or if the condition fails to improve as anticipated.  I discussed the assessment and treatment plan with the patient. The patient was provided an opportunity to ask questions and all were answered. The patient agreed with the plan and demonstrated an understanding of the instructions.  I, Rashana Andrew, PA-C have reviewed all documentation for this visit. The documentation on 03/30/2024  for the exam, diagnosis, procedures, and orders are all accurate and complete.  Jolynn Spencer, [facility], MMS [facility] [phone] (phone) [phone] (fax)  [facility]

## 2024-03-31 ENCOUNTER — Ambulatory Visit: Payer: Self-pay

## 2024-03-31 LAB — CBC WITH DIFFERENTIAL/PLATELET
Basophils Absolute: 0.1 x10E3/uL (ref 0.0–0.2)
Basos: 1 %
EOS (ABSOLUTE): 0.2 x10E3/uL (ref 0.0–0.4)
Eos: 2 %
Hematocrit: 40.1 % (ref 34.0–46.6)
Hemoglobin: 13.2 g/dL (ref 11.1–15.9)
Immature Grans (Abs): 0 x10E3/uL (ref 0.0–0.1)
Immature Granulocytes: 0 %
Lymphocytes Absolute: 3 x10E3/uL (ref 0.7–3.1)
Lymphs: 34 %
MCH: 28 pg (ref 26.6–33.0)
MCHC: 32.9 g/dL (ref 31.5–35.7)
MCV: 85 fL (ref 79–97)
Monocytes Absolute: 0.7 x10E3/uL (ref 0.1–0.9)
Monocytes: 8 %
Neutrophils Absolute: 4.9 x10E3/uL (ref 1.4–7.0)
Neutrophils: 55 %
Platelets: 274 x10E3/uL (ref 150–450)
RBC: 4.71 x10E6/uL (ref 3.77–5.28)
RDW: 12.5 % (ref 11.7–15.4)
WBC: 8.9 x10E3/uL (ref 3.4–10.8)

## 2024-03-31 LAB — COMPREHENSIVE METABOLIC PANEL WITH GFR
ALT: 20 IU/L (ref 0–32)
AST: 16 IU/L (ref 0–40)
Albumin: 4.4 g/dL (ref 3.9–4.9)
Alkaline Phosphatase: 104 IU/L (ref 41–116)
BUN/Creatinine Ratio: 12 (ref 9–23)
BUN: 10 mg/dL (ref 6–20)
Bilirubin Total: 0.3 mg/dL (ref 0.0–1.2)
CO2: 24 mmol/L (ref 20–29)
Calcium: 9.6 mg/dL (ref 8.7–10.2)
Chloride: 103 mmol/L (ref 96–106)
Creatinine, Ser: 0.85 mg/dL (ref 0.57–1.00)
Globulin, Total: 2.8 g/dL (ref 1.5–4.5)
Glucose: 92 mg/dL (ref 70–99)
Potassium: 4.2 mmol/L (ref 3.5–5.2)
Sodium: 140 mmol/L (ref 134–144)
Total Protein: 7.2 g/dL (ref 6.0–8.5)
eGFR: 92 mL/min/1.73 (ref >59)

## 2024-03-31 LAB — CERVICOVAGINAL ANCILLARY ONLY
Bacterial Vaginitis (gardnerella): POSITIVE — AB
Candida Glabrata: NEGATIVE
Candida Vaginitis: NEGATIVE
Chlamydia: NEGATIVE
Comment: NEGATIVE
Comment: NEGATIVE
Comment: NEGATIVE
Comment: NEGATIVE
Comment: NEGATIVE
Comment: NORMAL
Neisseria Gonorrhea: NEGATIVE
Trichomonas: NEGATIVE

## 2024-03-31 LAB — HEMOGLOBIN A1C
Est. average glucose Bld gHb Est-mCnc: 114 mg/dL
Hgb A1c MFr Bld: 5.6 % (ref 4.8–5.6)

## 2024-03-31 LAB — LIPID PANEL
Chol/HDL Ratio: 4 ratio (ref 0.0–4.4)
Cholesterol, Total: 170 mg/dL (ref 100–199)
HDL: 43 mg/dL (ref >39)
LDL Chol Calc (NIH): 113 mg/dL — ABNORMAL HIGH (ref 0–99)
Triglycerides: 76 mg/dL (ref 0–149)
VLDL Cholesterol Cal: 14 mg/dL (ref 5–40)

## 2024-03-31 LAB — VITAMIN D 25 HYDROXY (VIT D DEFICIENCY, FRACTURES): Vit D, 25-Hydroxy: 20.6 ng/mL — ABNORMAL LOW (ref 30.0–100.0)

## 2024-03-31 LAB — T4, FREE: Free T4: 1.17 ng/dL (ref 0.82–1.77)

## 2024-03-31 LAB — TSH: TSH: 1.28 u[IU]/mL (ref 0.450–4.500)

## 2024-03-31 LAB — HIV ANTIBODY (ROUTINE TESTING W REFLEX): HIV Screen 4th Generation wRfx: NONREACTIVE

## 2024-03-31 LAB — RPR W/REFLEX TO TREPSURE: RPR: NONREACTIVE

## 2024-03-31 LAB — TREPONEMAL ANTIBODIES, TPPA: Treponemal Antibodies, TPPA: NONREACTIVE

## 2024-04-01 MED ORDER — VITAMIN D (ERGOCALCIFEROL) 1.25 MG (50000 UNIT) PO CAPS: 50000.0000 [IU] | ORAL_CAPSULE | ORAL | 0 refills | Status: AC

## 2024-04-01 MED ORDER — METRONIDAZOLE 500 MG PO TABS: 500.0000 mg | ORAL_TABLET | Freq: Two times a day (BID) | ORAL | 0 refills | Status: AC

## 2024-04-01 NOTE — Addendum Note (Signed)
 Addended by: Lauralye Kinn on: 04/01/2024 05:09 PM   Modules accepted: Orders

## 2024-04-06 ENCOUNTER — Ambulatory Visit
Admission: RE | Admit: 2024-04-06 | Discharge: 2024-04-06 | Disposition: A | Discharge status: Home / Self Care | Source: Ambulatory Visit | Attending: Physician Assistant | Admitting: Physician Assistant

## 2024-04-06 ENCOUNTER — Ambulatory Visit: Admitting: Physician Assistant

## 2024-04-06 DIAGNOSIS — E049 Nontoxic goiter, unspecified: Secondary | ICD-10-CM | POA: Insufficient documentation

## 2024-04-06 DIAGNOSIS — R221 Localized swelling, mass and lump, neck: Secondary | ICD-10-CM | POA: Diagnosis not present

## 2024-04-10 NOTE — Progress Notes (Deleted)
 Complete physical exam  Patient: Brittney [name]   DOB: 02/17/1991   33 y.o. Female  MRN: [id number] Visit Date: 04/13/2024  Today's healthcare provider: Jolynn Spencer, PA-C   No chief complaint on file.  Subjective    Brittney [name] is a 33 y.o. female who presents today for a complete physical exam.  She reports consuming a {diet types:17450} diet. {Exercise:19826} She generally feels {well/fairly well/poorly:18703}. She reports sleeping {well/fairly well/poorly:18703}. She {does/does not:200015} have additional problems to discuss today.  HPI  *** Discussed the use of AI scribe software for clinical note transcription with the patient, who gave verbal consent to proceed.  History of Present Illness     Last depression screening scores    03/30/2024    8:17 AM 10/09/2023   10:49 AM 06/27/2023    2:13 PM  PHQ 2/9 Scores  PHQ - 2 Score 0 6 6  PHQ- 9 Score 0 21  22      Data saved with a previous flowsheet row definition   Last fall risk screening    10/09/2023   10:49 AM  Fall Risk   Falls in the past year? 0  Number falls in past yr: 0  Injury with Fall? 0  Risk for fall due to : No Fall Risks  Follow up Falls evaluation completed   Last Audit-C alcohol use screening    06/19/2023    1:32 PM  Alcohol Use Disorder Test (AUDIT)  1. How often do you have a drink containing alcohol?   2. How many drinks containing alcohol do you have on a typical day when you are drinking?   3. How often do you have six or more drinks on one occasion?   AUDIT-C Score   4. How often during the last year have you found that you were not able to stop drinking once you had started?   5. How often during the last year have you failed to do what was normally expected from you because of drinking?   6. How often during the last year have you needed a first drink in the morning to get yourself going after a heavy drinking session?   7. How often during the last year have you had a feeling of  guilt of remorse after drinking?   8. How often during the last year have you been unable to remember what happened the night before because you had been drinking?   9. Have you or someone else been injured as a result of your drinking?   10. Has a relative or friend or a doctor or another health worker been concerned about your drinking or suggested you cut down?   Alcohol Use Disorder Identification Test Final Score (AUDIT)      Information is confidential and restricted. Go to Review Flowsheets to unlock data.   A score of 3 or more in women, and 4 or more in men indicates increased risk for alcohol abuse, EXCEPT if all of the points are from question 1   Past Medical History:  Diagnosis Date  . ADHD (attention deficit hyperactivity disorder)    Past Surgical History:  Procedure Laterality Date  . NO PAST SURGERIES     Social History   Socioeconomic History  . Marital status: Single    Spouse name: Not on file  . Number of children: 2  . Years of education: Not on file  . Highest education level: 9th grade  Occupational History  .  Occupation: Refurb tech    Comment: [employer]  Tobacco Use  . Smoking status: Every Day    Current packs/day: 0.50    Average packs/day: 0.5 packs/day for 17.0 years (8.5 ttl pk-yrs)    Types: Cigarettes  . Smokeless tobacco: Never  . Tobacco comments:    0.5 PPD -09/20/2022 khj  Vaping Use  . Vaping status: Never Used  Substance and Sexual Activity  . Alcohol use: Yes    Comment: socially  . Drug use: Yes    Types: Marijuana    Comment: 3-4 days out of the week  . Sexual activity: Yes    Partners: Male    Birth control/protection: Implant  Other Topics Concern  . Not on file  Social History Narrative  . Not on file   Social Drivers of Health   Financial Resource Strain: High Risk (06/19/2023)   Overall Financial Resource Strain (CARDIA)   . Difficulty of Paying Living Expenses: Very hard  Food Insecurity: No Food Insecurity  (06/19/2023)   Hunger Vital Sign   . Worried About Programme Researcher, Broadcasting/film/video in the Last Year: Never true   . Ran Out of Food in the Last Year: Never true  Transportation Needs: No Transportation Needs (06/19/2023)   PRAPARE - Transportation   . Lack of Transportation (Medical): No   . Lack of Transportation (Non-Medical): No  Physical Activity: Inactive (06/19/2023)   Exercise Vital Sign   . Days of Exercise per Week: 0 days   . Minutes of Exercise per Session: 0 min  Stress: Stress Concern Present (06/19/2023)   [employer] of Occupational Health - Occupational Stress Questionnaire   . Feeling of Stress : Very much  Social Connections: Moderately Integrated (06/19/2023)   Social Connection and Isolation Panel   . Frequency of Communication with Friends and Family: Twice a week   . Frequency of Social Gatherings with Friends and Family: Twice a week   . Attends Religious Services: 1 to 4 times per year   . Active Member of Clubs or Organizations: No   . Attends Banker Meetings: Never   . Marital Status: Living with partner  Intimate Partner Violence: Not At Risk (06/19/2023)   Humiliation, Afraid, Rape, and Kick questionnaire   . Fear of Current or Ex-Partner: No   . Emotionally Abused: No   . Physically Abused: No   . Sexually Abused: No   Family Status  Relation Name Status  . Mother  Alive  . Father  Alive  . Sister  Alive  . Brother  Alive  . Brother  Alive  . Mat Aunt  Deceased at age 83  . MGF  Alive  . MGM  Alive  . Daughter Efrain (2012) Alive  No partnership data on file   Family History  Problem Relation Age of Onset  . Anxiety disorder Mother   . ADD / ADHD Mother   . Hypertension Mother   . Hypertension Father   . Depression Sister   . Anxiety disorder Sister   . Asthma Brother   . Heart attack Maternal Aunt   . Thyroid  disease Maternal Aunt   . Heart disease Maternal Grandfather   . Diabetes Maternal Grandmother   . Depression Daughter   .  Anxiety disorder Daughter   . Asthma Daughter    Allergies  Allergen Reactions  . Amoxicillin-Pot Clavulanate Nausea And Vomiting    Patient Care Team: Kyngston Pickelsimer, PA-C as PCP - General (Physician Assistant)  Medications: Outpatient Medications Prior to Visit  Medication Sig  . etonogestrel (NEXPLANON) 68 MG IMPL implant 1 each once by Subdermal route.  . Vitamin D , Ergocalciferol , (DRISDOL ) 1.25 MG (50000 UNIT) CAPS capsule Take 1 capsule (50,000 Units total) by mouth every 7 (seven) days. For 8 weeks, follow with maintenance dose of 1500 to 2000 international units/day   No facility-administered medications prior to visit.    Review of Systems  All other systems reviewed and are negative.  Except see HPI  {Insert previous labs (optional):23779} {See past labs  Heme  Chem  Endocrine  Serology  Results Review (optional):1}  Objective    There were no vitals taken for this visit. {Insert last BP/Wt (optional):23777}{See vitals history (optional):1}    Physical Exam Vitals reviewed.  Constitutional:      General: She is not in acute distress.    Appearance: Normal appearance. She is well-developed. She is not ill-appearing, toxic-appearing or diaphoretic.  HENT:     Head: Normocephalic and atraumatic.     Right Ear: Tympanic membrane, ear canal and external ear normal.     Left Ear: Tympanic membrane, ear canal and external ear normal.     Nose: Nose normal. No congestion or rhinorrhea.     Mouth/Throat:     Mouth: Mucous membranes are moist.     Pharynx: Oropharynx is clear. No oropharyngeal exudate.  Eyes:     General: No scleral icterus.       Right eye: No discharge.        Left eye: No discharge.     Conjunctiva/sclera: Conjunctivae normal.     Pupils: Pupils are equal, round, and reactive to light.  Neck:     Thyroid : No thyromegaly.     Vascular: No carotid bruit.  Cardiovascular:     Rate and Rhythm: Normal rate and regular rhythm.     Pulses:  Normal pulses.     Heart sounds: Normal heart sounds. No murmur heard.    No friction rub. No gallop.  Pulmonary:     Effort: Pulmonary effort is normal. No respiratory distress.     Breath sounds: Normal breath sounds. No wheezing or rales.  Abdominal:     General: Abdomen is flat. Bowel sounds are normal. There is no distension.     Palpations: Abdomen is soft. There is no mass.     Tenderness: There is no abdominal tenderness. There is no right CVA tenderness, left CVA tenderness, guarding or rebound.     Hernia: No hernia is present.  Musculoskeletal:        General: No swelling, tenderness, deformity or signs of injury. Normal range of motion.     Cervical back: Normal range of motion and neck supple. No rigidity or tenderness.     Right lower leg: No edema.     Left lower leg: No edema.  Lymphadenopathy:     Cervical: No cervical adenopathy.  Skin:    General: Skin is warm and dry.     Coloration: Skin is not jaundiced or pale.     Findings: No bruising, erythema, lesion or rash.  Neurological:     Mental Status: She is alert and oriented to person, place, and time. Mental status is at baseline.     Gait: Gait normal.  Psychiatric:        Mood and Affect: Mood normal.        Behavior: Behavior normal.        Thought Content: Thought content  normal.        Judgment: Judgment normal.     No results found for any visits on 04/13/24.  Assessment & Plan    Routine Health Maintenance and Physical Exam  Exercise Activities and Dietary recommendations  Goals   None     Immunization History  Administered Date(s) Administered  . DTP 01/21/1990, 08/04/1990, 12/29/1990, 09/23/1991, 01/01/1994  . DTaP 01/21/1990, 08/04/1990, 12/29/1990, 09/23/1991, 01/01/1994  . HIB (PRP-OMP) 08/04/1990, 12/29/1990, 09/23/1991  . Hepatitis B 02/13/2001, 03/20/2001, 08/07/2001  . IPV 01/21/1990, 08/04/1990, 09/23/1991, 01/01/1994  . MMR 09/23/1991, 01/01/1994  . OPV 01/21/1990, 08/04/1990,  09/23/1991, 01/01/1994  . Tdap 08/08/2010, 01/24/2020    Health Maintenance  Topic Date Due  . COVID-19 Vaccine (1) Never done  . Pneumococcal Vaccine (1 of 2 - PCV) Never done  . HPV Vaccine (1 - 3-dose SCDM series) Never done  . Flu Shot  Never done  . Pap with HPV screening  02/04/2028  . DTaP/Tdap/Td vaccine (8 - Td or Tdap) 01/23/2030  . Hepatitis B Vaccine  Completed  . Hepatitis C Screening  Completed  . HIV Screening  Completed  . Meningitis B Vaccine  Aged Out    Discussed health benefits of physical activity, and encouraged her to engage in regular exercise appropriate for her age and condition.  Assessment and Plan Assessment & Plan      ***  No follow-ups on file.    The patient was advised to call back or seek an in-person evaluation if the symptoms worsen or if the condition fails to improve as anticipated.  I discussed the assessment and treatment plan with the patient. The patient was provided an opportunity to ask questions and all were answered. The patient agreed with the plan and demonstrated an understanding of the instructions.  I, Dionta Larke, PA-C have reviewed all documentation for this visit. The documentation on 04/13/2024  for the exam, diagnosis, procedures, and orders are all accurate and complete.  Jolynn Spencer, [facility], MMS [facility] [phone] (phone) [phone] (fax)  [facility]

## 2024-04-13 ENCOUNTER — Ambulatory Visit: Admitting: Physician Assistant

## 2024-04-13 DIAGNOSIS — R200 Anesthesia of skin: Secondary | ICD-10-CM

## 2024-04-13 DIAGNOSIS — Z0000 Encounter for general adult medical examination without abnormal findings: Secondary | ICD-10-CM

## 2024-04-13 DIAGNOSIS — F4310 Post-traumatic stress disorder, unspecified: Secondary | ICD-10-CM

## 2024-04-13 DIAGNOSIS — E559 Vitamin D deficiency, unspecified: Secondary | ICD-10-CM

## 2024-04-13 DIAGNOSIS — E049 Nontoxic goiter, unspecified: Secondary | ICD-10-CM

## 2024-04-13 DIAGNOSIS — F1220 Cannabis dependence, uncomplicated: Secondary | ICD-10-CM

## 2024-04-13 DIAGNOSIS — E669 Obesity, unspecified: Secondary | ICD-10-CM

## 2024-04-13 DIAGNOSIS — F17210 Nicotine dependence, cigarettes, uncomplicated: Secondary | ICD-10-CM

## 2024-04-13 DIAGNOSIS — F411 Generalized anxiety disorder: Secondary | ICD-10-CM

## 2024-04-13 DIAGNOSIS — L732 Hidradenitis suppurativa: Secondary | ICD-10-CM

## 2024-04-13 DIAGNOSIS — G43519 Persistent migraine aura without cerebral infarction, intractable, without status migrainosus: Secondary | ICD-10-CM

## 2024-04-13 DIAGNOSIS — F329 Major depressive disorder, single episode, unspecified: Secondary | ICD-10-CM

## 2024-04-13 DIAGNOSIS — E66812 Obesity, class 2: Secondary | ICD-10-CM

## 2024-04-13 DIAGNOSIS — G4733 Obstructive sleep apnea (adult) (pediatric): Secondary | ICD-10-CM

## 2024-04-27 ENCOUNTER — Encounter: Admitting: Physician Assistant

## 2024-05-04 ENCOUNTER — Encounter: Admitting: Physician Assistant

## 2024-05-04 DIAGNOSIS — F1220 Cannabis dependence, uncomplicated: Secondary | ICD-10-CM

## 2024-05-04 DIAGNOSIS — J3089 Other allergic rhinitis: Secondary | ICD-10-CM

## 2024-05-04 DIAGNOSIS — Z0000 Encounter for general adult medical examination without abnormal findings: Secondary | ICD-10-CM

## 2024-05-04 DIAGNOSIS — E559 Vitamin D deficiency, unspecified: Secondary | ICD-10-CM

## 2024-05-04 DIAGNOSIS — F332 Major depressive disorder, recurrent severe without psychotic features: Secondary | ICD-10-CM

## 2024-05-04 DIAGNOSIS — F411 Generalized anxiety disorder: Secondary | ICD-10-CM

## 2024-05-04 DIAGNOSIS — E669 Obesity, unspecified: Secondary | ICD-10-CM

## 2024-05-04 DIAGNOSIS — F4310 Post-traumatic stress disorder, unspecified: Secondary | ICD-10-CM

## 2024-05-04 DIAGNOSIS — F17210 Nicotine dependence, cigarettes, uncomplicated: Secondary | ICD-10-CM

## 2024-05-04 DIAGNOSIS — E049 Nontoxic goiter, unspecified: Secondary | ICD-10-CM

## 2024-05-04 DIAGNOSIS — G4733 Obstructive sleep apnea (adult) (pediatric): Secondary | ICD-10-CM

## 2024-05-04 DIAGNOSIS — L732 Hidradenitis suppurativa: Secondary | ICD-10-CM

## 2024-06-02 ENCOUNTER — Ambulatory Visit: Admitting: Physician Assistant

## 2024-06-02 ENCOUNTER — Ambulatory Visit: Admitting: Family Medicine

## 2024-06-02 DIAGNOSIS — Z23 Encounter for immunization: Secondary | ICD-10-CM

## 2024-06-07 NOTE — Progress Notes (Unsigned)
 "    Complete physical exam  Patient: Brittney [name]   DOB: 08/09/1990   34 y.o. Female  MRN: [id number] Visit Date: 06/10/2024  Today's healthcare provider: Jolynn Spencer, PA-C   Chief Complaint  Patient presents with   Annual Exam     Diet: unhealthy  Exercise: walking & on feet mostly  Feeling: feeling ok  Sleeping: 6-7 daily  No concerns : None   Pt declined pneumococcal vaccine    Subjective    Brittney [name] is a 34 y.o. female who presents today for a complete physical exam.   HPI     Annual Exam    Additional comments:  Diet: unhealthy  Exercise: walking & on feet mostly  Feeling: feeling ok  Sleeping: 6-7 daily  No concerns : None   Pt declined pneumococcal vaccine       Last edited by Rosas, Joseline E, CMA on 06/10/2024  3:18 PM.     Discussed the use of AI scribe software for clinical note transcription with the patient, who gave verbal consent to proceed.  History of Present Illness Brittney [name] is a 34 year old female who presents for an annual physical exam.   She is not using CPAP for obstructive sleep apnea due to dental issues and the machine was repossessed by insurance after low use.  She is not taking medication for depression and feels stable.  She takes vitamin D  once weekly as prescribed, with the last dose yesterday.  She has no current symptoms of bacterial vaginitis and feels well.  She has allergies with some congestion.  She can hold her urine without difficulty and has normal bowel movements. Her menses are monthly, not heavy or prolonged, and she can predict onset and end.  She performs regular self-breast exams and has noticed no abnormalities.    Last depression screening scores    06/10/2024    3:18 PM 03/30/2024    8:17 AM 10/09/2023   10:49 AM  PHQ 2/9 Scores  PHQ - 2 Score 0 0 6  PHQ- 9 Score  0 21      Data saved with a previous flowsheet row definition   Last fall risk screening    06/10/2024    3:18 PM  Fall  Risk   Falls in the past year? 0  Number falls in past yr: 0  Injury with Fall? 0  Risk for fall due to : No Fall Risks  Follow up Falls evaluation completed   Last Audit-C alcohol use screening    06/19/2023    1:32 PM  Alcohol Use Disorder Test (AUDIT)  1. How often do you have a drink containing alcohol?   2. How many drinks containing alcohol do you have on a typical day when you are drinking?   3. How often do you have six or more drinks on one occasion?   AUDIT-C Score   4. How often during the last year have you found that you were not able to stop drinking once you had started?   5. How often during the last year have you failed to do what was normally expected from you because of drinking?   6. How often during the last year have you needed a first drink in the morning to get yourself going after a heavy drinking session?   7. How often during the last year have you had a feeling of guilt of remorse after drinking?   8. How often during  the last year have you been unable to remember what happened the night before because you had been drinking?   9. Have you or someone else been injured as a result of your drinking?   10. Has a relative or friend or a doctor or another health worker been concerned about your drinking or suggested you cut down?   Alcohol Use Disorder Identification Test Final Score (AUDIT)      Information is confidential and restricted. Go to Review Flowsheets to unlock data.   A score of 3 or more in women, and 4 or more in men indicates increased risk for alcohol abuse, EXCEPT if all of the points are from question 1   Past Medical History:  Diagnosis Date   ADHD (attention deficit hyperactivity disorder)    Past Surgical History:  Procedure Laterality Date   NO PAST SURGERIES     Social History   Socioeconomic History   Marital status: Single    Spouse name: Not on file   Number of children: 2   Years of education: Not on file   Highest education  level: 9th grade  Occupational History   Occupation: Refurb tech    Comment: [employer]  Tobacco Use   Smoking status: Every Day    Current packs/day: 0.50    Average packs/day: 0.5 packs/day for 17.0 years (8.5 ttl pk-yrs)    Types: Cigarettes   Smokeless tobacco: Never   Tobacco comments:    0.5 PPD -09/20/2022 khj  Vaping Use   Vaping status: Never Used  Substance and Sexual Activity   Alcohol use: Yes    Comment: socially   Drug use: Yes    Types: Marijuana    Comment: 3-4 days out of the week   Sexual activity: Yes    Partners: Male    Birth control/protection: Implant  Other Topics Concern   Not on file  Social History Narrative   Not on file   Social Drivers of Health   Tobacco Use: High Risk (06/10/2024)   Patient History    Smoking Tobacco Use: Every Day    Smokeless Tobacco Use: Never    Passive Exposure: Not on file  Financial Resource Strain: High Risk (06/19/2023)   Overall Financial Resource Strain (CARDIA)    Difficulty of Paying Living Expenses: Very hard  Food Insecurity: No Food Insecurity (06/19/2023)   Hunger Vital Sign    Worried About Running Out of Food in the Last Year: Never true    Ran Out of Food in the Last Year: Never true  Transportation Needs: No Transportation Needs (06/19/2023)   PRAPARE - Administrator, Civil Service (Medical): No    Lack of Transportation (Non-Medical): No  Physical Activity: Inactive (06/19/2023)   Exercise Vital Sign    Days of Exercise per Week: 0 days    Minutes of Exercise per Session: 0 min  Stress: Stress Concern Present (06/19/2023)   [employer] of Occupational Health - Occupational Stress Questionnaire    Feeling of Stress : Very much  Social Connections: Moderately Integrated (06/19/2023)   Social Connection and Isolation Panel    Frequency of Communication with Friends and Family: Twice a week    Frequency of Social Gatherings with Friends and Family: Twice a week    Attends Religious Services:  1 to 4 times per year    Active Member of [employer] or Organizations: No    Attends Banker Meetings: Never    Marital Status:  Living with partner  Intimate Partner Violence: Not At Risk (06/19/2023)   Humiliation, Afraid, Rape, and Kick questionnaire    Fear of Current or Ex-Partner: No    Emotionally Abused: No    Physically Abused: No    Sexually Abused: No  Depression (PHQ2-9): Low Risk (06/10/2024)   Depression (PHQ2-9)    PHQ-2 Score: 0  Alcohol Screen: Low Risk (06/19/2023)   Alcohol Screen    Last Alcohol Screening Score (AUDIT): 5  Housing: High Risk (06/19/2023)   Housing Stability Vital Sign    Unable to Pay for Housing in the Last Year: Yes    Number of Times Moved in the Last Year: 0    Homeless in the Last Year: No  Utilities: Not At Risk (06/19/2023)   AHC Utilities    Threatened with loss of utilities: No  Health Literacy: Adequate Health Literacy (06/19/2023)   B1300 Health Literacy    Frequency of need for help with medical instructions: Never   Family Status  Relation Name Status   Mother  Alive   Father  Alive   Sister  Alive   Brother  Alive   Brother  Alive   Mat Aunt  Deceased at age 35   MGF  Alive   MGM  Alive   Daughter Kyra (2012) Alive  No partnership data on file   Family History  Problem Relation Age of Onset   Anxiety disorder Mother    ADD / ADHD Mother    Hypertension Mother    Hypertension Father    Depression Sister    Anxiety disorder Sister    Asthma Brother    Heart attack Maternal Aunt    Thyroid  disease Maternal Aunt    Heart disease Maternal Grandfather    Diabetes Maternal Grandmother    Depression Daughter    Anxiety disorder Daughter    Asthma Daughter    Allergies[1]  Patient Care Team: [place], Toshiye Kever, PA-C as PCP - General (Physician Assistant)   Medications: Show/hide medication list[2]  Review of Systems  All other systems reviewed and are negative.  Except see HPI  {Insert previous labs  (optional):23779} {See past labs  Heme  Chem  Endocrine  Serology  Results Review (optional):1}  Objective    BP 123/72 (BP Location: Right Arm, Patient Position: Sitting, Cuff Size: Normal)   Pulse 87   Ht 5' 7 (1.702 m)   Wt 232 lb 4.8 oz (105.4 kg)   SpO2 99%   BMI 36.38 kg/m  {Insert last BP/Wt (optional):23777}{See vitals history (optional):1}    Physical Exam Vitals reviewed.  Constitutional:      General: She is not in acute distress.    Appearance: Normal appearance. She is well-developed. She is obese. She is not ill-appearing, toxic-appearing or diaphoretic.  HENT:     Head: Normocephalic and atraumatic.     Right Ear: Tympanic membrane, ear canal and external ear normal.     Left Ear: Tympanic membrane, ear canal and external ear normal.     Nose: Nose normal. No congestion or rhinorrhea.     Mouth/Throat:     Mouth: Mucous membranes are moist.     Pharynx: Oropharynx is clear. Posterior oropharyngeal erythema present. No oropharyngeal exudate.     Comments: Postnasal drainage noted Eyes:     General: No scleral icterus.       Right eye: No discharge.        Left eye: No discharge.     Extraocular Movements:  Extraocular movements intact.     Conjunctiva/sclera: Conjunctivae normal.     Pupils: Pupils are equal, round, and reactive to light.  Neck:     Thyroid : No thyromegaly.     Vascular: No carotid bruit.  Cardiovascular:     Rate and Rhythm: Normal rate and regular rhythm.     Pulses: Normal pulses.     Heart sounds: Normal heart sounds. No murmur heard.    No friction rub. No gallop.  Pulmonary:     Effort: Pulmonary effort is normal. No respiratory distress.     Breath sounds: Normal breath sounds. No wheezing or rales.  Abdominal:     General: Abdomen is flat. Bowel sounds are normal. There is no distension.     Palpations: Abdomen is soft. There is no mass.     Tenderness: There is no abdominal tenderness. There is no right CVA tenderness,  left CVA tenderness, guarding or rebound.     Hernia: No hernia is present.  Musculoskeletal:        General: No swelling, tenderness, deformity or signs of injury. Normal range of motion.     Cervical back: Normal range of motion and neck supple. No rigidity or tenderness.     Right lower leg: No edema.     Left lower leg: No edema.  Lymphadenopathy:     Cervical: No cervical adenopathy.  Skin:    General: Skin is warm and dry.     Coloration: Skin is not jaundiced or pale.     Findings: No bruising, erythema, lesion or rash.  Neurological:     Mental Status: She is alert and oriented to person, place, and time. Mental status is at baseline.     Gait: Gait normal.  Psychiatric:        Mood and Affect: Mood normal.        Behavior: Behavior normal.        Thought Content: Thought content normal.        Judgment: Judgment normal.     Breast and pelvic exam deferred  No results found for any visits on 06/10/24.  Assessment & Plan    Routine Health Maintenance and Physical Exam  Exercise Activities and Dietary recommendations  Goals   None     Immunization History  Administered Date(s) Administered   DTP 01/21/1990, 08/04/1990, 12/29/1990, 09/23/1991, 01/01/1994   DTaP 01/21/1990, 08/04/1990, 12/29/1990, 09/23/1991, 01/01/1994   HIB (PRP-OMP) 08/04/1990, 12/29/1990, 09/23/1991   Hepatitis B 02/13/2001, 03/20/2001, 08/07/2001   IPV 01/21/1990, 08/04/1990, 09/23/1991, 01/01/1994   MMR 09/23/1991, 01/01/1994   OPV 01/21/1990, 08/04/1990, 09/23/1991, 01/01/1994   Tdap 08/08/2010, 01/24/2020    Health Maintenance  Topic Date Due   COVID-19 Vaccine (1) Never done   Pneumococcal Vaccine (1 of 2 - PCV) Never done   Flu Shot  08/10/2024*   Pap with HPV screening  02/04/2028   DTaP/Tdap/Td vaccine (8 - Td or Tdap) 01/23/2030   Hepatitis B Vaccine  Completed   HPV Vaccine (No Doses Required) Completed   Hepatitis C Screening  Completed   HIV Screening  Completed    Meningitis B Vaccine  Aged Out  *Topic was postponed. The date shown is not the original due date.    Discussed health benefits of physical activity, and encouraged her to engage in regular exercise appropriate for her age and condition.  Assessment and Plan Assessment & Plan Woman's Wellness Visit Routine wellness visit with no significant complaints. - Scheduled Pap smear in 1-2 months,  ensuring it is done before or after menstrual period. - Will perform bacterial vaginosis screening during Pap smear visit. - Will schedule follow-up in one year for physical exam.  Obesity Chronic and unstable Body mass index is 36.38 kg/m. Weight loss of 5% of pt's current weight via healthy diet and daily exercise encouraged. Continue lifestyle modifications Will follow-up  Obstructive sleep apnea Chronic and unstable No current use of CPAP machine due to dental issues and insurance requirements. Machine was returned after one month of non-use. Consider a referral to pulmonology Will follow-up  Vitamin D  deficiency Chronic and unstable Currently taking vitamin D  50,000 IU weekly. - Will repeat vitamin D  level at next visit. Will follow-up  PTSD (post-traumatic stress disorder) Mood disorder GAD (generalized anxiety disorder) No current medication for depression. Advised to consider in clinic counseling and CCM Will revisit  Nicotine dependence Smoking cessation was advised. 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 No symptoms of thyroid  dysfunction reported. - Continue to monitor for symptoms such as tiredness, heat intolerance, cold intolerance, or lump in throat.  Allergic rhinitis Chronic Mild congestion noted, likely related to allergies. No acute symptoms requiring intervention.  - Use nasal saline rinses before nose sprays such as with Neilmed Sinus Rinse bottle.  Use distilled water.   -  Use Flonase 2 sprays each nostril daily. Aim upward and outward. - Use Zyrtec 10 mg daily.  Will follow-up  Annual physical exam (Primary) UTD on dental/eye Things to do to keep yourself healthy  - Exercise at least 30-45 minutes a day, 3-4 days a week.  - Eat a low-fat diet with lots of fruits and vegetables, up to 7-9 servings per day.  - Seatbelts can save your life. Wear them always.  - Smoke detectors on every level of your home, check batteries every year.  - Eye Doctor - have an eye exam every 1-2 years  - Safe sex - if you may be exposed to STDs, use a condom.  - Alcohol -  If you drink, do it moderately, less than 2 drinks per day.  - Health Care Power of Attorney. Choose someone to speak for you if you are not able.  - Depression is common in our stressful world.If you're feeling down or losing interest in things you normally enjoy, please come in for a visit.  - Violence - If anyone is threatening or hurting you, please call immediately.  Marijuana dependence (HCC) Cessation advised Will revisit  No follow-ups on file.    The patient was advised to call back or seek an in-person evaluation if the symptoms worsen or if the condition fails to improve as anticipated.  I discussed the assessment and treatment plan with the patient. The patient was provided an opportunity to ask questions and all were answered. The patient agreed with the plan and demonstrated an understanding of the instructions.  I, Luvada Salamone, PA-C have reviewed all documentation for this visit. The documentation on 06/10/2024  for the exam, diagnosis, procedures, and orders are all accurate and complete.  Jolynn Spencer, [facility], MMS [facility] [phone] (phone) [phone] (fax)  [facility]     [1]  Allergies Allergen Reactions   Amoxicillin-Pot Clavulanate Nausea And Vomiting  [2]  Outpatient Medications Prior to Visit  Medication Sig   etonogestrel (NEXPLANON)  68 MG IMPL implant 1 each once by Subdermal route.   Vitamin D , Ergocalciferol , (DRISDOL )  1.25 MG (50000 UNIT) CAPS capsule Take 1 capsule (50,000 Units total) by mouth every 7 (seven) days. For 8 weeks, follow with maintenance dose of 1500 to 2000 international units/day   No facility-administered medications prior to visit.   "

## 2024-06-10 ENCOUNTER — Encounter: Payer: Self-pay | Admitting: Physician Assistant

## 2024-06-10 ENCOUNTER — Ambulatory Visit: Admitting: Physician Assistant

## 2024-06-10 VITALS — BP 123/72 | HR 87 | Ht 67.0 in | Wt 232.3 lb

## 2024-06-10 DIAGNOSIS — F411 Generalized anxiety disorder: Secondary | ICD-10-CM

## 2024-06-10 DIAGNOSIS — G4733 Obstructive sleep apnea (adult) (pediatric): Secondary | ICD-10-CM | POA: Diagnosis not present

## 2024-06-10 DIAGNOSIS — F39 Unspecified mood [affective] disorder: Secondary | ICD-10-CM | POA: Diagnosis not present

## 2024-06-10 DIAGNOSIS — E559 Vitamin D deficiency, unspecified: Secondary | ICD-10-CM | POA: Diagnosis not present

## 2024-06-10 DIAGNOSIS — F17210 Nicotine dependence, cigarettes, uncomplicated: Secondary | ICD-10-CM | POA: Diagnosis not present

## 2024-06-10 DIAGNOSIS — F1220 Cannabis dependence, uncomplicated: Secondary | ICD-10-CM

## 2024-06-10 DIAGNOSIS — E669 Obesity, unspecified: Secondary | ICD-10-CM | POA: Diagnosis not present

## 2024-06-10 DIAGNOSIS — J3089 Other allergic rhinitis: Secondary | ICD-10-CM | POA: Diagnosis not present

## 2024-06-10 DIAGNOSIS — Z0000 Encounter for general adult medical examination without abnormal findings: Secondary | ICD-10-CM | POA: Diagnosis not present

## 2024-06-10 DIAGNOSIS — L732 Hidradenitis suppurativa: Secondary | ICD-10-CM

## 2024-06-10 DIAGNOSIS — F4310 Post-traumatic stress disorder, unspecified: Secondary | ICD-10-CM

## 2024-06-10 DIAGNOSIS — E049 Nontoxic goiter, unspecified: Secondary | ICD-10-CM

## 2024-06-10 DIAGNOSIS — Z23 Encounter for immunization: Secondary | ICD-10-CM

## 2024-06-10 DIAGNOSIS — Z113 Encounter for screening for infections with a predominantly sexual mode of transmission: Secondary | ICD-10-CM

## 2024-06-11 DIAGNOSIS — E049 Nontoxic goiter, unspecified: Secondary | ICD-10-CM | POA: Insufficient documentation

## 2024-06-11 DIAGNOSIS — J309 Allergic rhinitis, unspecified: Secondary | ICD-10-CM | POA: Insufficient documentation

## 2024-06-25 ENCOUNTER — Ambulatory Visit: Admitting: Physician Assistant
